# Patient Record
Sex: Male | Born: 1970 | Hispanic: Yes | Marital: Married | Smoking: Never smoker
Health system: Southern US, Community
[De-identification: ages and names within clinical notes are randomized; demographics above are authoritative.]

## PROBLEM LIST (undated history)

## (undated) DIAGNOSIS — I10 Essential (primary) hypertension: Secondary | ICD-10-CM

## (undated) HISTORY — DX: Essential (primary) hypertension: I10

---

## 2012-08-14 ENCOUNTER — Ambulatory Visit: Payer: Self-pay | Admitting: Physician Assistant

## 2015-08-02 ENCOUNTER — Emergency Department: Payer: Self-pay

## 2015-08-02 ENCOUNTER — Encounter: Payer: Self-pay | Admitting: Emergency Medicine

## 2015-08-02 ENCOUNTER — Emergency Department
Admission: EM | Admit: 2015-08-02 | Discharge: 2015-08-03 | Disposition: A | Discharge status: Home / Self Care | Payer: Self-pay | Attending: Emergency Medicine | Admitting: Emergency Medicine

## 2015-08-02 DIAGNOSIS — N200 Calculus of kidney: Secondary | ICD-10-CM | POA: Insufficient documentation

## 2015-08-02 LAB — COMPREHENSIVE METABOLIC PANEL
ALBUMIN: 4.5 g/dL (ref 3.5–5.0)
ALK PHOS: 72 U/L (ref 38–126)
ALT: 27 U/L (ref 17–63)
ANION GAP: 7 (ref 5–15)
AST: 25 U/L (ref 15–41)
BILIRUBIN TOTAL: 0.5 mg/dL (ref 0.3–1.2)
BUN: 16 mg/dL (ref 6–20)
CALCIUM: 9.4 mg/dL (ref 8.9–10.3)
CO2: 26 mmol/L (ref 22–32)
Chloride: 106 mmol/L (ref 101–111)
Creatinine, Ser: 0.98 mg/dL (ref 0.61–1.24)
GFR calc Af Amer: >60 mL/min (ref >60)
GLUCOSE: 101 mg/dL — AB (ref 65–99)
POTASSIUM: 3.4 mmol/L — AB (ref 3.5–5.1)
Sodium: 139 mmol/L (ref 135–145)
TOTAL PROTEIN: 8 g/dL (ref 6.5–8.1)

## 2015-08-02 LAB — URINALYSIS COMPLETE WITH MICROSCOPIC (ARMC ONLY)
BACTERIA UA: NONE SEEN
Bilirubin Urine: NEGATIVE
Glucose, UA: NEGATIVE mg/dL
Ketones, ur: NEGATIVE mg/dL
LEUKOCYTES UA: NEGATIVE
NITRITE: NEGATIVE
PROTEIN: NEGATIVE mg/dL
SPECIFIC GRAVITY, URINE: 1.021 (ref 1.005–1.030)
pH: 5 (ref 5.0–8.0)

## 2015-08-02 LAB — CBC
HEMATOCRIT: 44 % (ref 40.0–52.0)
Hemoglobin: 14.7 g/dL (ref 13.0–18.0)
MCH: 29.5 pg (ref 26.0–34.0)
MCHC: 33.5 g/dL (ref 32.0–36.0)
MCV: 87.9 fL (ref 80.0–100.0)
PLATELETS: 295 10*3/uL (ref 150–440)
RBC: 5 MIL/uL (ref 4.40–5.90)
RDW: 13.4 % (ref 11.5–14.5)
WBC: 8.9 10*3/uL (ref 3.8–10.6)

## 2015-08-02 LAB — LIPASE, BLOOD: Lipase: 23 U/L (ref 11–51)

## 2015-08-02 MED ORDER — ONDANSETRON HCL 4 MG/2ML IJ SOLN
4.0000 mg | Freq: Once | INTRAMUSCULAR | Status: AC
  Administered 2015-08-03: 4 mg via INTRAVENOUS
  Filled 2015-08-02: qty 2

## 2015-08-02 MED ORDER — SODIUM CHLORIDE 0.9 % IV BOLUS (SEPSIS)
1000.0000 mL | Freq: Once | INTRAVENOUS | Status: AC
  Administered 2015-08-03: 1000 mL via INTRAVENOUS

## 2015-08-02 MED ORDER — ONDANSETRON 4 MG PO TBDP: 4.0000 mg | ORAL_TABLET | Freq: Three times a day (TID) | ORAL | Status: DC | PRN

## 2015-08-02 MED ORDER — MORPHINE SULFATE (PF) 2 MG/ML IV SOLN
2.0000 mg | Freq: Once | INTRAVENOUS | Status: AC
  Administered 2015-08-03: 2 mg via INTRAVENOUS
  Filled 2015-08-02: qty 1

## 2015-08-02 MED ORDER — OXYCODONE HCL 5 MG PO TABS: 5.0000 mg | ORAL_TABLET | ORAL | Status: DC | PRN

## 2015-08-02 NOTE — ED Notes (Signed)
Pt states bilateral lower quadrant pain that began today after eating. Pt states 'i can feel it when i walk" pt states has had vomiting. Pt appears uncomfortable with ambulation.

## 2015-08-02 NOTE — ED Provider Notes (Signed)
[facility] Emergency Department Provider Note   ____________________________________________  Time seen: Approximately 11:19 PM  I have reviewed the triage vital signs and the nursing notes.   HISTORY  Chief Complaint Abdominal Pain    HPI Victor GrimesJoel Arabie is a 45 y.o. male who presents to the ED from home with a chief complaint of abdominal and flank pain. Patient states he was eating approximately 3 hours ago when he developed upper abdominal cramping which radiated into his back. Now complains of left-sided flank pain radiating to his left groin. Symptoms associated with nausea and vomiting 1. Denies associated fever, chills, chest pain, shortness of breath, dysuria, hematuria, diarrhea. Denies recent travel or trauma. Nothing makes his symptoms better or worse.   Past medical history None  There are no active problems to display for this patient.   History reviewed. No pertinent past surgical history.  Current Outpatient Rx  Name  Route  Sig  Dispense  Refill  . ondansetron (ZOFRAN ODT) 4 MG disintegrating tablet   Oral   Take 1 tablet (4 mg total) by mouth every 8 (eight) hours as needed for nausea or vomiting.   20 tablet   0   . oxyCODONE (ROXICODONE) 5 MG immediate release tablet   Oral   Take 1 tablet (5 mg total) by mouth every 4 (four) hours as needed for severe pain.   20 tablet   0     Allergies Ibuprofen and Tylenol  History reviewed. No pertinent family history.  Social History Social History  Substance Use Topics  . Smoking status: Never Smoker   . Smokeless tobacco: Never Used  . Alcohol Use: No    Review of Systems  Constitutional: No fever/chills. Eyes: No visual changes. ENT: No sore throat. Cardiovascular: Denies chest pain. Respiratory: Denies shortness of breath. Gastrointestinal: Positive for abdominal pain.  As a for nausea and vomiting.  No diarrhea.  No constipation. Genitourinary: Negative for  dysuria. Musculoskeletal: Positive for back pain. Skin: Negative for rash. Neurological: Negative for headaches, focal weakness or numbness.  10-point ROS otherwise negative.  ____________________________________________   PHYSICAL EXAM:  VITAL SIGNS: ED Triage Vitals  Enc Vitals Group     BP 08/02/15 2224 157/98 mmHg     Pulse Rate 08/02/15 2224 74     Resp 08/02/15 2224 18     Temp 08/02/15 2224 97.8 F (36.6 C)     Temp Source 08/02/15 2224 Oral     SpO2 08/02/15 2224 97 %     Weight 08/02/15 2224 165 lb (74.844 kg)     Height 08/02/15 2224 5\' 5"  (1.651 m)     Head Cir --      Peak Flow --      Pain Score 08/02/15 2229 8     Pain Loc --      Pain Edu? --      Excl. in GC? --     Constitutional: Alert and oriented. Well appearing and in no acute distress. Eyes: Conjunctivae are normal. PERRL. EOMI. Head: Atraumatic. Nose: No congestion/rhinnorhea. Mouth/Throat: Mucous membranes are moist.  Oropharynx non-erythematous. Neck: No stridor.   Cardiovascular: Normal rate, regular rhythm. Grossly normal heart sounds.  Good peripheral circulation. Respiratory: Normal respiratory effort.  No retractions. Lungs CTAB. Gastrointestinal: Soft and nontender. No distention. No abdominal bruits. No CVA tenderness. Musculoskeletal: No lower extremity tenderness nor edema.  No joint effusions. Neurologic:  Normal speech and language. No gross focal neurologic deficits are appreciated. No gait  instability. Skin:  Skin is warm, dry and intact. No rash noted. Psychiatric: Mood and affect are normal. Speech and behavior are normal.  ____________________________________________   LABS (all labs ordered are listed, but only abnormal results are displayed)  Labs Reviewed  COMPREHENSIVE METABOLIC PANEL - Abnormal; Notable for the following:    Potassium 3.4 (*)    Glucose, Bld 101 (*)    All other components within normal limits  URINALYSIS COMPLETEWITH MICROSCOPIC ([facility] ONLY) -  Abnormal; Notable for the following:    Color, Urine YELLOW (*)    APPearance CLEAR (*)    Hgb urine dipstick 2+ (*)    Squamous Epithelial / LPF 0-5 (*)    All other components within normal limits  CBC  LIPASE, BLOOD   ____________________________________________  EKG  None ____________________________________________  RADIOLOGY  CT renal stone study interpreted per Dr. Cherly Hensen: 1. No acute abnormality seen within the abdomen or pelvis. 2. Small stone seen dependently within the bladder, likely reflecting a recently passed stone. 3. Tiny nonobstructing 3 mm stone at the interpole region of the right kidney. ____________________________________________   PROCEDURES  Procedure(s) performed: None  Critical Care performed: No  ____________________________________________   INITIAL IMPRESSION / ASSESSMENT AND PLAN / ED COURSE  Pertinent labs & imaging results that were available during my care of the patient were reviewed by me and considered in my medical decision making (see chart for details).  45-year-old male who presents with left flank to groin pain. Pain is significantly improved currently without intervention. Laboratory urinalysis results notable for microscopic hematuria; suspect nephrolithiasis. Will obtain CT renal stone study, initiate IV fluid resuscitation, IV analgesia and reassess.  ----------------------------------------- 12:43 AM on 08/03/2015 -----------------------------------------  Patient improved. Updated patient and family members of CT results. Plan for analgesia and follow-up with PCP. Strict return precautions given. Patient verbalizes understanding and agrees with plan of care. ____________________________________________   FINAL CLINICAL IMPRESSION(S) / ED DIAGNOSES  Final diagnoses:  Kidney stone      NEW MEDICATIONS STARTED DURING THIS VISIT:  New Prescriptions   ONDANSETRON (ZOFRAN ODT) 4 MG DISINTEGRATING TABLET    Take 1  tablet (4 mg total) by mouth every 8 (eight) hours as needed for nausea or vomiting.   OXYCODONE (ROXICODONE) 5 MG IMMEDIATE RELEASE TABLET    Take 1 tablet (5 mg total) by mouth every 4 (four) hours as needed for severe pain.     Note:  This document was prepared using Dragon voice recognition software and may include unintentional dictation errors.    Irean Hong, MD 08/03/15 [phone]

## 2015-08-02 NOTE — ED Notes (Signed)
Pt states pain is more on left side and radiates to left groin.

## 2015-08-02 NOTE — ED Notes (Signed)
Patient transported to CT 

## 2015-08-02 NOTE — Discharge Instructions (Signed)
1. You may take pain and nausea medicines as needed (oxycodone/Zofran #20). 2. Drink plenty of bottled or filtered water daily. 3. Return to the ER for worsening symptoms, persistent vomiting, fever or other concerns.  Clculos renais (Kidney Stones) Os clculos renais (urolitase) so depsitos que se formam nos rins. A dor intensa  causada pelo deslocamento do clculo pelo trato urinrio. Quando o clculo se move, a uretra sofre um espasmo ao redor dele. O clculo  em geral eliminado pela urina.  CAUSAS   Um distrbio que faz com que certas glndulas do pescoo produzam hormnio paratireideo demais (hipertireoidismo primrio).  Um acmulo de cristais de cido rico, similar  gota, nas articulaes.  Estreitamento  Furniture conservator/restorer.  Uma obstruo do rim presente no nascimento (obstruo congnita).  Cirurgia anterior no rim ou ureteres.  Numerosas infeces renais. SINTOMAS   Enjoo estomacal (nusea).  Vmitos.  Sangue na urina (hematria).  Dor que em geral se espalha (irradia) para a virilha.  Necessidade de [place] com frequncia ou [place]. DIAGNSTICO   Histrico mdico e exame fsico.  Exames de sangue e de urina.  Tomografia computadorizada.  Ocasionalmente, um exame da parte interna da bexiga (cistoscopia  realizado. TRATAMENTO   Observao.  Aumento da ingesto de lquidos.  Litotripsia extracorprea por ondas de choque - este  um procedimento no invasivo que [place] ondas de choque para Research scientist (physical sciences) clculos renais.  Cirurgia pode ser [place] voc experimente dores intensas ou obstruo persistente. H diversos procedimentos cirrgicos. A maioria dos procedimentos  realizada com o uso de pequenos instrumentos. Somente pequenas incises so necessrias para acomodar esses instrumentos. Assim, o tempo de recuperao  minimizado. O tamanho, a localizao e a composio qumica so todas variveis importantes que determinaro a escolha do mtodo de ao  apropriado para voc. [place] com o seu profissional de sade para entender melhor sua situao de maneira a minimizar o risco de leses para voc e para o seu rim.  INSTRUES PARA O TRATAMENTO DOMICILIAR   Beba gua e lquidos em quantidade suficiente para manter a urina clara ou amarelo-plida. Isso ajudar voc a eliminar o clculo ou os fragmentos de clculo.  Passe toda a urina pelo filtro fornecido. Guarde todo o material particulado e todos os clculos para o seu profissional de Museum/gallery exhibitions officer. O clculo causador da dor pode ser to pequeno quanto um gro de sal.  muito importante usar o filtro todas as vezes que Holiday representative. Guardar o clculo permitir que o seu profissional de sade o analise e confirme que o clculo foi de fato eliminado. A anlise do clculo com frequncia indica o que voc pode fazer para reduzir a incidncia de Journalist, newspaper.  Tome somente medicamentos vendidos sem receita mdica em caso de dor, desconforto ou febre, conforme indicado pelo seu profissional de sade.  Comparea a todas as consultas de acompanhamento conforme as orientaes do seu mdico. Isso  importante.  Faa exames de raios X conforme solicitado. A ausncia de dor nem sempre significa que o clculo foi eliminado. Ele pode ter apenas parado de se deslocar. Caso a urina permanea completamente obstruda, isso pode causar perda da funo renal e at [employer] do rim.  sua responsabilidade certificar-se de que as radiografias e acompanhamentos sejam realizados. Ultrassom do rim pode mostrar bloqueios e a situao do rim. O ultrassom no envolve radiao e pode ser realizado facilmente em questo de minutos.  Faa alteraes na sua dieta diria de acordo com as orientaes do seu mdico. Ele pode dizer para  voc:  Limitar a quantidade de sal que ingere.  Comer 5 ou mais pores de frutas e verduras todos os dias.  Limitar a quantidade de carne, aves, peixe e ovos que voc  ingere.  Coletar uma amostra de urina de 24 horas, de acordo com as instrues do seu mdico. Voc poder precisar coletar uma Annice Needynova amostra de urina a cada 6-12 meses. PROCURE UM MDICO SE:  Sentir dores progressivas que no respondem ao analgsico prescrito. PROCURE UM MDICO IMEDIATAMENTE SE:   A dor no puder ser controlada com o medicamento prescrito.  Voc apresentar febre ou calafrios com tremores.  A gravidade ou a intensidade da dor aumentar por mais de 18 horas e no for WellPointaliviada com analgsicos.  Voc desenvolver novas dores abdominais.  Sentir-se fraco ou prestes a desmaiar.  No conseguir urinar.   Estas informaes no se destinam a substituir as recomendaes de seu mdico. No deixe de discutir quaisquer dvidas com seu mdico.   Document Released: 02/21/2005 Document Revised: 11/12/2014 Elsevier Interactive Patient Education [employer]2016 Elsevier Inc.

## 2017-02-17 ENCOUNTER — Emergency Department: Payer: Self-pay

## 2017-02-17 ENCOUNTER — Emergency Department
Admission: EM | Admit: 2017-02-17 | Discharge: 2017-02-17 | Disposition: A | Discharge status: Home / Self Care | Payer: Self-pay | Attending: Emergency Medicine | Admitting: Emergency Medicine

## 2017-02-17 ENCOUNTER — Other Ambulatory Visit: Payer: Self-pay

## 2017-02-17 DIAGNOSIS — Z79899 Other long term (current) drug therapy: Secondary | ICD-10-CM | POA: Insufficient documentation

## 2017-02-17 DIAGNOSIS — R509 Fever, unspecified: Secondary | ICD-10-CM | POA: Insufficient documentation

## 2017-02-17 DIAGNOSIS — R002 Palpitations: Secondary | ICD-10-CM | POA: Insufficient documentation

## 2017-02-17 LAB — BASIC METABOLIC PANEL
Anion gap: 8 (ref 5–15)
BUN: 12 mg/dL (ref 6–20)
CALCIUM: 9.1 mg/dL (ref 8.9–10.3)
CO2: 27 mmol/L (ref 22–32)
CREATININE: 1.02 mg/dL (ref 0.61–1.24)
Chloride: 102 mmol/L (ref 101–111)
GFR calc Af Amer: >60 mL/min (ref >60)
GFR calc non Af Amer: >60 mL/min (ref >60)
GLUCOSE: 105 mg/dL — AB (ref 65–99)
Potassium: 3.8 mmol/L (ref 3.5–5.1)
Sodium: 137 mmol/L (ref 135–145)

## 2017-02-17 LAB — TROPONIN I

## 2017-02-17 LAB — CBC
HCT: 43.3 % (ref 40.0–52.0)
HEMOGLOBIN: 14.8 g/dL (ref 13.0–18.0)
MCH: 29.7 pg (ref 26.0–34.0)
MCHC: 34.2 g/dL (ref 32.0–36.0)
MCV: 86.7 fL (ref 80.0–100.0)
Platelets: 297 10*3/uL (ref 150–440)
RBC: 5 MIL/uL (ref 4.40–5.90)
RDW: 13 % (ref 11.5–14.5)
WBC: 11.9 10*3/uL — ABNORMAL HIGH (ref 3.8–10.6)

## 2017-02-17 LAB — FIBRIN DERIVATIVES D-DIMER (ARMC ONLY): Fibrin derivatives D-dimer (ARMC): 116.13 ng/mL (FEU) (ref 0.00–499.00)

## 2017-02-17 NOTE — ED Triage Notes (Signed)
Pt presents to ED with c/o L sided CP and felt like heart was beating faster while at work. Denies any other symptoms. Alert, oriented, ambulatory. States symptoms began while sitting, denies over exertion.

## 2017-02-17 NOTE — ED Provider Notes (Signed)
[facility] Emergency Department Provider Note  Time seen: 3:07 PM  I have reviewed the triage vital signs and the nursing notes.   HISTORY  Chief Complaint Chest Pain    HPI Victor Russo is a 46 y.o. male with no past medical history who presents to the emergency department for chest palpitations and chills/fever.  According to the patient since this morning he has been experiencing some mild left-sided chest discomfort with a feeling of palpitations.  He also states he feels a sensation of fever all over his body.  States mild nasal congestion.  Denies cough.  Denies abdominal pain.  States he feels like he is hungry and wants to eat.   History reviewed. No pertinent past medical history.  There are no active problems to display for this patient.   History reviewed. No pertinent surgical history.  Prior to Admission medications   Medication Sig Start Date End Date Taking? Authorizing Provider  ondansetron (ZOFRAN ODT) 4 MG disintegrating tablet Take 1 tablet (4 mg total) by mouth every 8 (eight) hours as needed for nausea or vomiting. 08/02/15   Irean HongSung, Jade J, MD  oxyCODONE (ROXICODONE) 5 MG immediate release tablet Take 1 tablet (5 mg total) by mouth every 4 (four) hours as needed for severe pain. 08/02/15   Irean HongSung, Jade J, MD    Allergies  Allergen Reactions  . Ibuprofen Hives  . Tylenol [Acetaminophen] Hives    History reviewed. No pertinent family history.  Social History Social History   Tobacco Use  . Smoking status: Never Smoker  . Smokeless tobacco: Never Used  Substance Use Topics  . Alcohol use: No  . Drug use: No    Review of Systems Constitutional: Subjective fever. ENT: Mild congestion Cardiovascular: Mild left-sided chest discomfort/palpitations since this morning Respiratory: Negative for shortness of breath.  Negative for cough Gastrointestinal: Negative for abdominal pain, vomiting  Musculoskeletal: Negative for leg pain or  swelling All other ROS negative  ____________________________________________   PHYSICAL EXAM:  VITAL SIGNS: ED Triage Vitals  Enc Vitals Group     BP 02/17/17 1310 (!) 155/95     Pulse Rate 02/17/17 1310 (!) 108     Resp 02/17/17 1310 18     Temp 02/17/17 1310 99.4 F (37.4 C)     Temp Source 02/17/17 1310 Oral     SpO2 02/17/17 1310 100 %     Weight 02/17/17 1312 184 lb (83.5 kg)     Height 02/17/17 1312 5\' 5"  (1.651 m)     Head Circumference --      Peak Flow --      Pain Score 02/17/17 1310 5     Pain Loc --      Pain Edu? --      Excl. in GC? --    Constitutional: Alert and oriented. Well appearing and in no distress. Eyes: Normal exam ENT   Head: Normocephalic and atraumatic.   Mouth/Throat: Mucous membranes are moist. Cardiovascular: Normal rate, regular rhythm. No murmur Respiratory: Normal respiratory effort without tachypnea nor retractions. Breath sounds are clear Gastrointestinal: Soft and nontender. No distention.   Musculoskeletal: Nontender with normal range of motion in all extremities. No lower extremity tenderness or edema. Neurologic:  Normal speech and language. No gross focal neurologic deficits Skin:  Skin is warm, dry and intact.  Psychiatric: Mood and affect are normal.   ____________________________________________    EKG  EKG reviewed and interpreted by myself shows sinus tachycardia 111 bpm with  a narrow QRS, normal axis, normal intervals, nonspecific ST changes but no ST elevation.  ____________________________________________    RADIOLOGY  Chest x-ray normal  ____________________________________________   INITIAL IMPRESSION / ASSESSMENT AND PLAN / ED COURSE  Pertinent labs & imaging results that were available during my care of the patient were reviewed by me and considered in my medical decision making (see chart for details).  Patient presents to the emergency department for palpitations and a sensation of fever.   Overall the patient appears extremely well.  Differential would include palpitations, viral illness, less likely ACS or PE.  Patient's labs have resulted largely within normal limits including a negative troponin, very slight leukocytosis.  Chest x-ray is normal.  Overall patient has a normal physical exam.  Given the patient's S1Q3T3 on EKG with mild tachycardia we will obtain a d-dimer as a precaution.  No pleuritic pain.  No leg pain or swelling.  No history of blood clot in the past.  The patient's tachycardia is more likely due to his low-grade fever.  D-dimer is negative.  Suspect likely early viral illness.  Discussed supportive care at home including Tylenol or ibuprofen plenty of fluids and follow-up with PCP if needed.  Patient agreeable to plan.  ____________________________________________   FINAL CLINICAL IMPRESSION(S) / ED DIAGNOSES  Palpitations Fever    Minna AntisPaduchowski, Sallyann Kinnaird, MD 02/17/17 [phone]

## 2017-07-01 ENCOUNTER — Emergency Department: Payer: Self-pay

## 2017-07-01 ENCOUNTER — Other Ambulatory Visit: Payer: Self-pay

## 2017-07-01 ENCOUNTER — Emergency Department
Admission: EM | Admit: 2017-07-01 | Discharge: 2017-07-01 | Disposition: A | Discharge status: Home / Self Care | Payer: Self-pay | Attending: Emergency Medicine | Admitting: Emergency Medicine

## 2017-07-01 DIAGNOSIS — N200 Calculus of kidney: Secondary | ICD-10-CM | POA: Insufficient documentation

## 2017-07-01 LAB — BASIC METABOLIC PANEL
Anion gap: 5 (ref 5–15)
BUN: 20 mg/dL (ref 6–20)
CALCIUM: 8.8 mg/dL — AB (ref 8.9–10.3)
CO2: 26 mmol/L (ref 22–32)
Chloride: 108 mmol/L (ref 101–111)
Creatinine, Ser: 1.04 mg/dL (ref 0.61–1.24)
GFR calc Af Amer: >60 mL/min (ref >60)
GLUCOSE: 119 mg/dL — AB (ref 65–99)
POTASSIUM: 3.9 mmol/L (ref 3.5–5.1)
Sodium: 139 mmol/L (ref 135–145)

## 2017-07-01 LAB — URINALYSIS, COMPLETE (UACMP) WITH MICROSCOPIC
Bacteria, UA: NONE SEEN
Bilirubin Urine: NEGATIVE
GLUCOSE, UA: NEGATIVE mg/dL
KETONES UR: NEGATIVE mg/dL
Leukocytes, UA: NEGATIVE
NITRITE: NEGATIVE
PROTEIN: NEGATIVE mg/dL
Specific Gravity, Urine: 1.021 (ref 1.005–1.030)
pH: 7 (ref 5.0–8.0)

## 2017-07-01 LAB — CBC
HEMATOCRIT: 43.4 % (ref 40.0–52.0)
Hemoglobin: 14.9 g/dL (ref 13.0–18.0)
MCH: 30.1 pg (ref 26.0–34.0)
MCHC: 34.4 g/dL (ref 32.0–36.0)
MCV: 87.5 fL (ref 80.0–100.0)
PLATELETS: 283 10*3/uL (ref 150–440)
RBC: 4.97 MIL/uL (ref 4.40–5.90)
RDW: 13 % (ref 11.5–14.5)
WBC: 8.3 10*3/uL (ref 3.8–10.6)

## 2017-07-01 MED ORDER — OXYCODONE HCL 5 MG PO TABS: 5.0000 mg | ORAL_TABLET | ORAL | 0 refills | Status: DC | PRN

## 2017-07-01 MED ORDER — ONDANSETRON 4 MG PO TBDP: 4.0000 mg | ORAL_TABLET | Freq: Three times a day (TID) | ORAL | 0 refills | Status: DC | PRN

## 2017-07-01 MED ORDER — HYDROMORPHONE HCL 1 MG/ML IJ SOLN
INTRAMUSCULAR | Status: AC
  Filled 2017-07-01: qty 1

## 2017-07-01 MED ORDER — SODIUM CHLORIDE 0.9 % IV SOLN
Freq: Once | INTRAVENOUS | Status: AC
  Administered 2017-07-01: 22:00:00 via INTRAVENOUS

## 2017-07-01 MED ORDER — MORPHINE SULFATE (PF) 2 MG/ML IV SOLN
2.0000 mg | Freq: Once | INTRAVENOUS | Status: AC
  Administered 2017-07-01: 2 mg via INTRAVENOUS
  Filled 2017-07-01: qty 1

## 2017-07-01 MED ORDER — HYDROMORPHONE HCL 1 MG/ML IJ SOLN
1.0000 mg | Freq: Once | INTRAMUSCULAR | Status: AC
  Administered 2017-07-01: 1 mg via INTRAMUSCULAR

## 2017-07-01 MED ORDER — MORPHINE SULFATE (PF) 4 MG/ML IV SOLN
4.0000 mg | Freq: Once | INTRAVENOUS | Status: AC
  Administered 2017-07-01: 4 mg via INTRAVENOUS
  Filled 2017-07-01: qty 1

## 2017-07-01 MED ORDER — ONDANSETRON HCL 4 MG/2ML IJ SOLN
4.0000 mg | Freq: Once | INTRAMUSCULAR | Status: AC
  Administered 2017-07-01: 4 mg via INTRAVENOUS
  Filled 2017-07-01: qty 2

## 2017-07-01 MED ORDER — HYDROMORPHONE HCL 1 MG/ML IJ SOLN: 0.5000 mg | Freq: Once | INTRAMUSCULAR | Status: DC

## 2017-07-01 MED ORDER — TAMSULOSIN HCL 0.4 MG PO CAPS: 0.4000 mg | ORAL_CAPSULE | Freq: Every day | ORAL | 0 refills | Status: DC

## 2017-07-01 MED ORDER — OXYCODONE HCL 5 MG PO TABS
5.0000 mg | ORAL_TABLET | Freq: Once | ORAL | Status: AC
Start: 2017-07-01 — End: 2017-07-01
  Administered 2017-07-01: 5 mg via ORAL
  Filled 2017-07-01: qty 1

## 2017-07-01 NOTE — ED Notes (Signed)
Patient given a warm blanket at this time.  

## 2017-07-01 NOTE — ED Triage Notes (Signed)
Pt states history of renal calculi. Pt complains of right sided flank pain radiating to right groin with nausea and vomiting for 2 hours. Pt appears in no acute distress, skin normal color warm and dry. No meds taken at home.

## 2017-07-01 NOTE — ED Notes (Signed)
Spoke with dr. Mayford Knife regarding pt's pain and allergy to ibuprofen and tylenol, no new pain medication orders received. kub ordered, explanation to pt given regarding dr. Jarrett Ables. Pt verbalizes understanding.

## 2017-07-01 NOTE — ED Provider Notes (Signed)
[facility] Emergency Department Provider Note       Time seen: ----------------------------------------- 10:52 PM on 07/01/2017 -----------------------------------------   I have reviewed the triage vital signs and the nursing notes.  HISTORY   Chief Complaint Flank Pain    HPI Victor Russo is a 47 y.o. male with a history of kidney stones who presents to the ED for acute onset right flank pain that radiates into his groin.  He is had the symptoms associated with nausea vomiting for the past 2 hours, nothing makes it better.  He denies fevers, chills or other complaints.  No past medical history on file.  There are no active problems to display for this patient.   No past surgical history on file.  Allergies Ibuprofen and Tylenol [acetaminophen]  Social History Social History   Tobacco Use  . Smoking status: Never Smoker  . Smokeless tobacco: Never Used  Substance Use Topics  . Alcohol use: No  . Drug use: No    Review of Systems Constitutional: Negative for fever. Cardiovascular: Negative for chest pain. Respiratory: Negative for shortness of breath. Gastrointestinal: Positive for flank pain, vomiting Genitourinary: Negative for dysuria. Musculoskeletal: Negative for back pain. Skin: Negative for rash. Neurological: Negative for headaches, focal weakness or numbness.  All systems negative/normal/unremarkable except as stated in the HPI  ____________________________________________   PHYSICAL EXAM:  VITAL SIGNS: ED Triage Vitals  Enc Vitals Group     BP 07/01/17 1901 138/82     Pulse Rate 07/01/17 1901 77     Resp 07/01/17 1901 16     Temp 07/01/17 1901 98 F (36.7 C)     Temp Source 07/01/17 1901 Oral     SpO2 07/01/17 1901 98 %     Weight 07/01/17 1902 180 lb (81.6 kg)     Height 07/01/17 1902  (1.651 m)     Head Circumference --      Peak Flow --      Pain Score 07/01/17 1901 8     Pain Loc --      Pain Edu? --       Excl. in GC? --    Constitutional: Alert and oriented.  Mild distress Eyes: Conjunctivae are normal. Normal extraocular movements. Cardiovascular: Normal rate, regular rhythm. No murmurs, rubs, or gallops. Respiratory: Normal respiratory effort without tachypnea nor retractions. Breath sounds are clear and equal bilaterally. No wheezes/rales/rhonchi. Gastrointestinal: Right flank tenderness, no rebound or guarding.  Normal bowel sounds. Musculoskeletal: Nontender with normal range of motion in extremities. No lower extremity tenderness nor edema. Neurologic:  Normal speech and language. No gross focal neurologic deficits are appreciated.  Skin:  Skin is warm, dry and intact. No rash noted. Psychiatric: Mood and affect are normal. Speech and behavior are normal.  ____________________________________________  ED COURSE:  As part of my medical decision making, I reviewed the following data within the electronic MEDICAL RECORD NUMBER History obtained from family if available, nursing notes, old chart and ekg, as well as notes from prior ED visits. Patient presented for flank pain, we will assess with labs and imaging as indicated at this time.   Procedures ____________________________________________   LABS (pertinent positives/negatives)  Labs Reviewed  URINALYSIS, COMPLETE (UACMP) WITH MICROSCOPIC - Abnormal; Notable for the following components:      Result Value   Color, Urine YELLOW (*)    APPearance CLEAR (*)    Hgb urine dipstick MODERATE (*)    RBC / HPF >50 (*)  All other components within normal limits  BASIC METABOLIC PANEL - Abnormal; Notable for the following components:   Glucose, Bld 119 (*)    Calcium 8.8 (*)    All other components within normal limits  CBC    RADIOLOGY  CT renal protocol IMPRESSION: 1. 3 mm right ureterovesicular junction stone causes mild right hydroureteronephrosis. This is the small stone noted on the current radiographs. 2. No other  acute abnormalities. 3. Two small right and 1 tiny left nonobstructing intrarenal stones. 4. Hepatic steatosis. ____________________________________________  DIFFERENTIAL DIAGNOSIS   Renal colic, UTI, pyelonephritis, musculoskeletal pain  FINAL ASSESSMENT AND PLAN  Renal colic   Plan: The patient had presented for right flank pain. Patient's labs were indicative of renal colic with hematuria. Patient's imaging did reveal a distal right ureteral stone.  His pain was controlled with morphine, Toradol and fluids with Zofran.  He will be discharged with similar, he is cleared for outpatient follow-up with urology.   Ulice Dash, MD   Note: This note was generated in part or whole with voice recognition software. Voice recognition is usually quite accurate but there are transcription errors that can and very often do occur. I apologize for any typographical errors that were not detected and corrected.     Emily Filbert, MD 07/01/17 2253

## 2017-07-01 NOTE — ED Notes (Signed)
Tech called to say pt had stopped her walking by sub wait to let her know his pain was not any better; back to speak with pt to let him know it's only been 20 minutes since his IM medication and it will take more time for medication to improve his pain; pt verbalized understanding

## 2017-07-01 NOTE — ED Notes (Signed)
Pt informed he cannot drive for 4 hours after pain medication and must remain seated in sub wait. Pt verbalizes understanding.

## 2017-12-31 ENCOUNTER — Emergency Department
Admission: EM | Admit: 2017-12-31 | Discharge: 2017-12-31 | Disposition: A | Discharge status: Home / Self Care | Payer: Medicaid Other | Attending: Emergency Medicine | Admitting: Emergency Medicine

## 2017-12-31 ENCOUNTER — Encounter: Payer: Self-pay | Admitting: Emergency Medicine

## 2017-12-31 ENCOUNTER — Other Ambulatory Visit: Payer: Self-pay

## 2017-12-31 DIAGNOSIS — S3992XA Unspecified injury of lower back, initial encounter: Secondary | ICD-10-CM | POA: Diagnosis present

## 2017-12-31 DIAGNOSIS — Y9289 Other specified places as the place of occurrence of the external cause: Secondary | ICD-10-CM | POA: Insufficient documentation

## 2017-12-31 DIAGNOSIS — Y33XXXA Other specified events, undetermined intent, initial encounter: Secondary | ICD-10-CM | POA: Diagnosis not present

## 2017-12-31 DIAGNOSIS — S39012A Strain of muscle, fascia and tendon of lower back, initial encounter: Secondary | ICD-10-CM | POA: Insufficient documentation

## 2017-12-31 DIAGNOSIS — Y9389 Activity, other specified: Secondary | ICD-10-CM | POA: Insufficient documentation

## 2017-12-31 DIAGNOSIS — Y990 Civilian activity done for income or pay: Secondary | ICD-10-CM | POA: Insufficient documentation

## 2017-12-31 MED ORDER — ORPHENADRINE CITRATE 30 MG/ML IJ SOLN
60.0000 mg | INTRAMUSCULAR | Status: AC
  Administered 2017-12-31: 60 mg via INTRAMUSCULAR
  Filled 2017-12-31: qty 2

## 2017-12-31 MED ORDER — CYCLOBENZAPRINE HCL 5 MG PO TABS: 5.0000 mg | ORAL_TABLET | Freq: Three times a day (TID) | ORAL | 0 refills | Status: DC | PRN

## 2017-12-31 MED ORDER — KETOROLAC TROMETHAMINE 30 MG/ML IJ SOLN
30.0000 mg | Freq: Once | INTRAMUSCULAR | Status: AC
  Administered 2017-12-31: 30 mg via INTRAMUSCULAR
  Filled 2017-12-31: qty 1

## 2017-12-31 MED ORDER — KETOROLAC TROMETHAMINE 10 MG PO TABS: 10.0000 mg | ORAL_TABLET | Freq: Three times a day (TID) | ORAL | 0 refills | Status: DC

## 2017-12-31 MED ORDER — DIPHENHYDRAMINE HCL 25 MG PO CAPS
25.0000 mg | ORAL_CAPSULE | Freq: Once | ORAL | Status: AC
  Administered 2017-12-31: 25 mg via ORAL
  Filled 2017-12-31: qty 1

## 2017-12-31 NOTE — ED Triage Notes (Signed)
Pt to ED via POV c/o back pain since Friday. Pt states that he was carrying some windows while working and hurt his back then. Pt states that the pain has continued to get worse. Pt is in NAD at this time.

## 2017-12-31 NOTE — Discharge Instructions (Addendum)
You will be treated for

## 2017-12-31 NOTE — ED Notes (Signed)
Back pain  abd pain   Pt  Vomited  3  Days  Ago    nausea   Last bm  Today  Pt awake  And  Alert and oriented

## 2017-12-31 NOTE — ED Provider Notes (Signed)
[facility] Emergency Department Provider Note ____________________________________________  Time seen: 1746  I have reviewed the triage vital signs and the nursing notes.  HISTORY  Chief Complaint  Back Pain  HPI Victor Russo is a 47 y.o. male presents to the ED for evaluation of lower back pain. He reports onset while at work on Thursday. 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  He reports that normally they should be moved by 2 people.  He describes sudden onset of low back pain at the time.  He was treated on Friday by a local chiropractor and reported 2 days of improvement until symptoms returned today.  He presents now with continued pain to the low back.  He denies any distal paresthesias, footdrop, bladder incontinence, saddle anesthesia, or weakness.  He has taken a single dose of Roxicet that he had from a recent kidney stone, today with some benefit to his symptoms.  He is also been using a TENS unit as well as a cool pad with some relief to his symptoms.  He denies any history of chronic ongoing low back pain.  Patient does report a sensitivity to ibuprofen and anti-inflammatories, but reports that he can tolerate these medications when he doses Benadryl along with them.  History reviewed. No pertinent past medical history.  There are no active problems to display for this patient.  History reviewed. No pertinent surgical history.  Prior to Admission medications   Medication Sig Start Date End Date Taking? Authorizing Provider  ondansetron (ZOFRAN ODT) 4 MG disintegrating tablet Take 1 tablet (4 mg total) by mouth every 8 (eight) hours as needed for nausea or vomiting. 08/02/15   Irean Hong, MD  ondansetron (ZOFRAN ODT) 4 MG disintegrating tablet Take 1 tablet (4 mg total) by mouth every 8 (eight) hours as needed for nausea or vomiting. 07/01/17    Emily Filbert, MD  oxyCODONE (ROXICODONE) 5 MG immediate release tablet Take 1 tablet (5 mg total) by mouth every 4 (four) hours as needed for severe pain. 08/02/15   Irean Hong, MD  oxyCODONE (ROXICODONE) 5 MG immediate release tablet Take 1 tablet (5 mg total) by mouth every 4 (four) hours as needed for severe pain. 07/01/17   Emily Filbert, MD  tamsulosin (FLOMAX) 0.4 MG CAPS capsule Take 1 capsule (0.4 mg total) by mouth daily after breakfast. 07/01/17   Emily Filbert, MD    Allergies Ibuprofen and Tylenol [acetaminophen]  No family history on file.  Social History Social History   Tobacco Use  . Smoking status: Never Smoker  . Smokeless tobacco: Never Used  Substance Use Topics  . Alcohol use: No  . Drug use: No    Review of Systems  Constitutional: Negative for fever. Cardiovascular: Negative for chest pain. Respiratory: Negative for shortness of breath. Gastrointestinal: Negative for abdominal pain, vomiting and diarrhea. Genitourinary: Negative for dysuria. Musculoskeletal: Positive for back pain. Skin: Negative for rash. Neurological: Negative for headaches, focal weakness or numbness. ____________________________________________  PHYSICAL EXAM:  VITAL SIGNS: ED Triage Vitals  Enc Vitals Group     BP 12/31/17 1645 (!) 152/94     Pulse Rate 12/31/17 1645 85     Resp 12/31/17 1645 20     Temp 12/31/17 1645 98.3 F (36.8 C)     Temp Source 12/31/17 1645 Oral     SpO2 12/31/17 1645 98 %  Weight --      Height --      Head Circumference --      Peak Flow --      Pain Score 12/31/17 1649 8     Pain Loc --      Pain Edu? --      Excl. in GC? --     Constitutional: Alert and oriented. Well appearing and in no distress. Head: Normocephalic and atraumatic. Eyes: Conjunctivae are normal. Normal extraocular movements Cardiovascular: Normal rate, regular rhythm. Normal distal pulses. Respiratory: Normal respiratory effort. No  wheezes/rales/rhonchi. Gastrointestinal: Soft and nontender. No distention, bowel, guarding, or rigidity.  Normal bowel sounds noted. Musculoskeletal: Normal spinal alignment without midline tenderness, spasm, deformity, or step-off.  Patient transitions from supine to sit without assistance. Nontender with normal range of motion in all extremities.  Neurologic: Cranial nerves II through XII grossly intact.  Normal LE DTRs bilaterally.  Normal toe dorsiflexion foot eversion on exam.  Negative seated straight leg raise bilaterally.  Normal gait without ataxia. Normal speech and language. No gross focal neurologic deficits are appreciated. Skin:  Skin is warm, dry and intact. No rash noted. ____________________________________________  PROCEDURES  Procedures Toradol 30 mg IM Norflex 60 mg IM Benadryl 25 mg PO ____________________________________________  INITIAL IMPRESSION / ASSESSMENT AND PLAN / ED COURSE  Patient with ED evaluation of acute lumbar strain.  Patient reports improvement of his symptoms after I M administration of ketorolac and Norflex.  He is given Benadryl to help counter any potential urticaria or hives.  Patient responded well and is without any adverse reaction.  Will be discharged with a prescription for ketorolac to dose as directed.  He is advised he probably should continue dosing Benadryl as needed with the medication to prevent any allergic reaction.  He will follow-up with his companies provider or [place] urgent care for ongoing symptom management.  Work is provided for 3 days as requested. ____________________________________________  FINAL CLINICAL IMPRESSION(S) / ED DIAGNOSES  Final diagnoses:  Strain of lumbar region, initial encounter      Lissa Hoard, PA-C 12/31/17 2004    Jeanmarie Plant, MD 01/11/18 [phone]

## 2019-05-02 IMAGING — CR DG ABDOMEN 1V
1 series · 2 of 2 positions shown · non-contrast
Comparison: CT from 08/02/2015

CLINICAL DATA: History of right renal calculi. Patient presents
with right-sided flank and groin pain with nausea and vomiting x2
hours.

EXAM:
ABDOMEN - 1 VIEW

[Series 1: dg abd 1 view · 0.14mm/px · 2 of 2 slices shown]
[im 1/2]
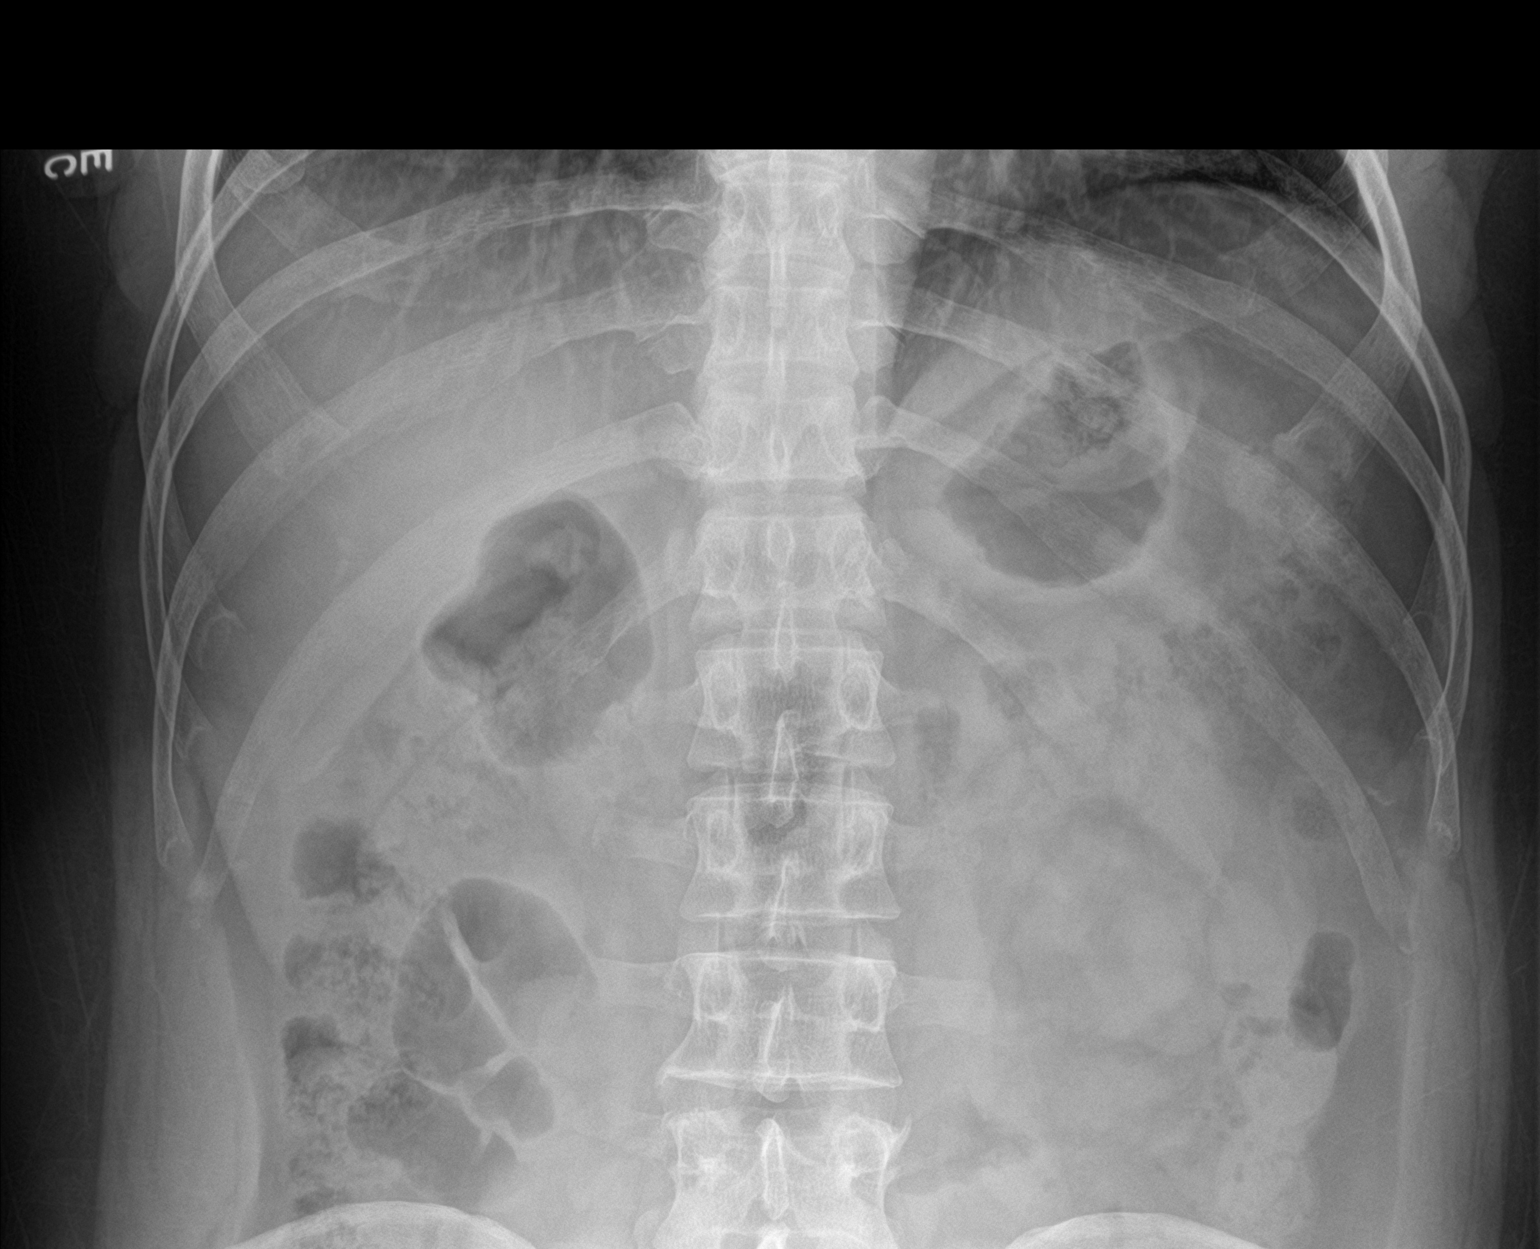
[im 2/2]
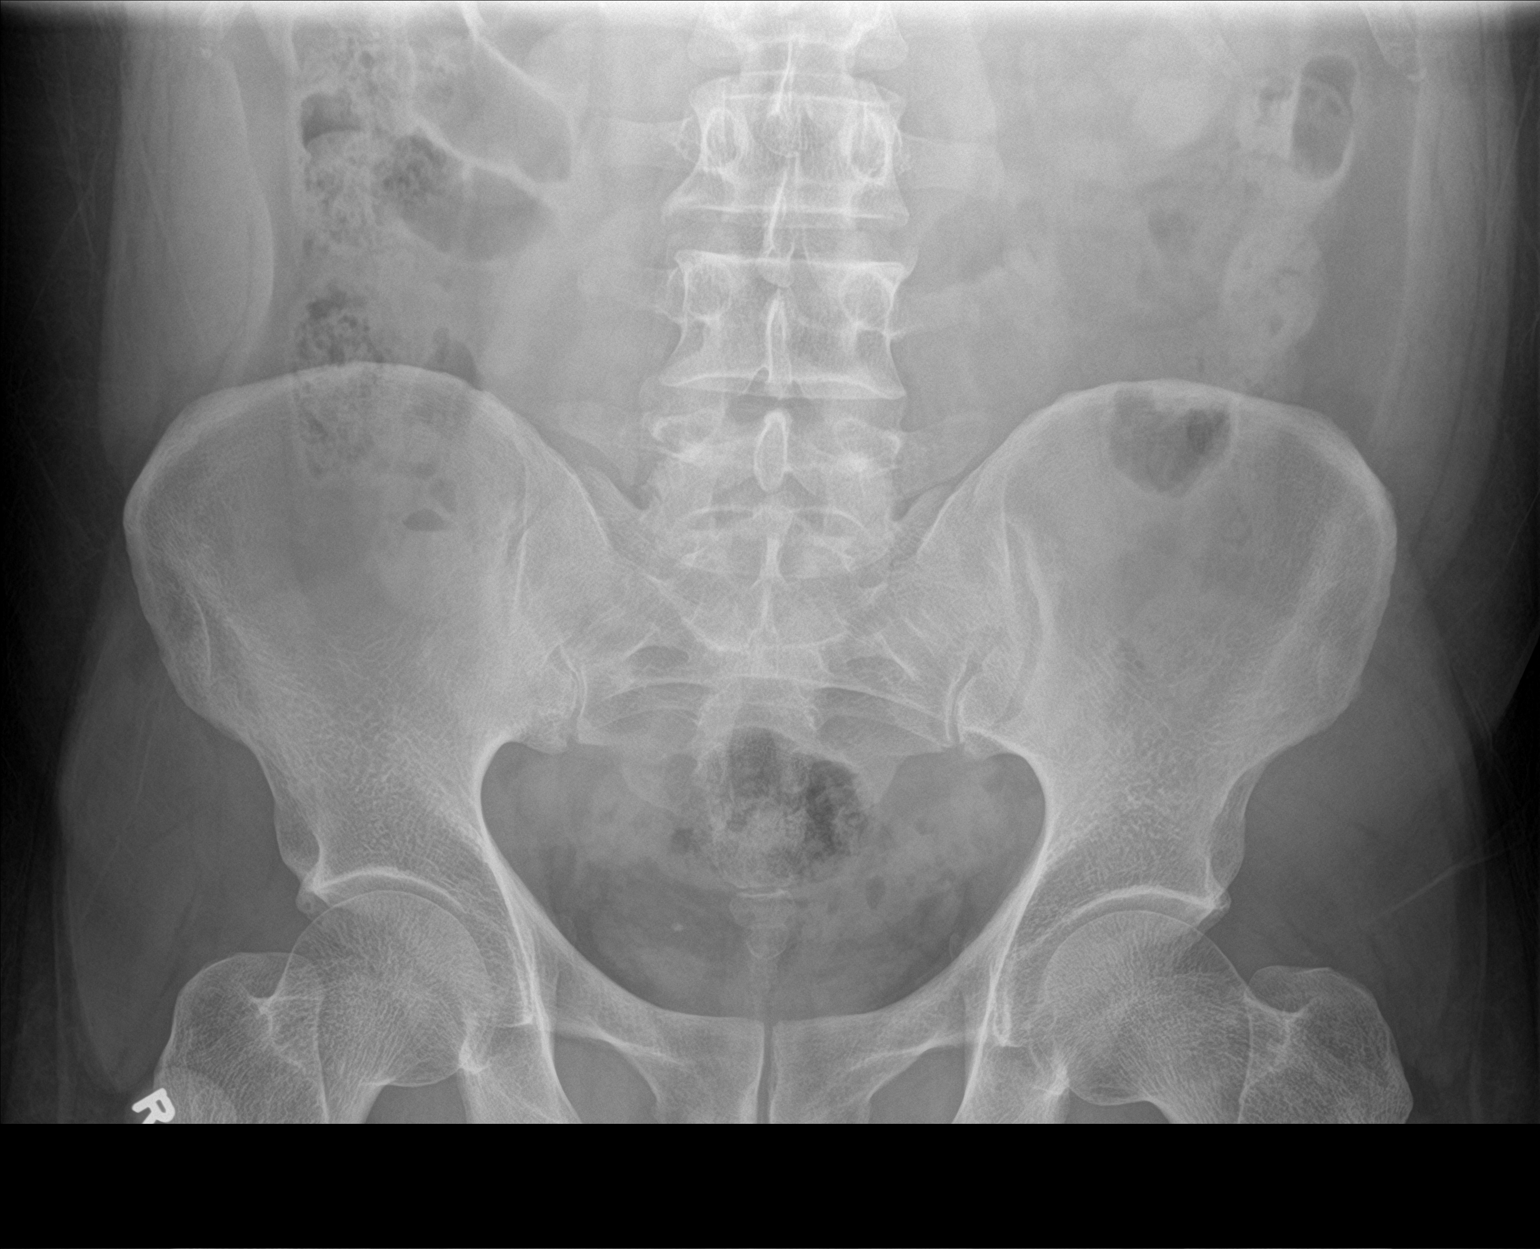

[2 of 2 positions shown; findings below may reference images not displayed]

FINDINGS: A punctate 2 mm calculus projects within the right hemipelvis which
may correspond with the patient's known bladder calculus as seen on
prior CT. A new distal ureteral calculus is not entirely excluded.
Both renal shadows are obscured by overlying bowel. No definite
uropathy identified along the course of the expected ureters.
Unremarkable bowel gas pattern. No acute osseous abnormality.
IMPRESSION: 2 mm calcification in the right hemipelvis may reflect the patient's
known bladder calculus. New distal right ureteral calculus however
is not entirely excluded given patient's right-sided flank and groin
pain. CT may help for further correlation.

## 2019-05-02 IMAGING — CT CT RENAL STONE PROTOCOL
2 of 4 series · 16 of 46 positions shown, 18 images · non-contrast
Comparison: Current abdominal radiographs noting a small
calcification in the right pelvis. Prior CT, 08/02/2015.

CLINICAL DATA: Pt states history of renal calculi. Pt complains of
right sided flank pain radiating to right groin with nausea and
vomiting for 2 hours

EXAM:
CT ABDOMEN AND PELVIS WITHOUT CONTRAST
TECHNIQUE: Multidetector CT imaging of the abdomen and pelvis was performed
following the standard protocol without IV contrast.

[Series 2: stone full standard · axial · 0.78mm/px · z∈[-455,-25]mm · 13 of 96 slices shown, 15 images]
[im 5/96  soft-tissue]
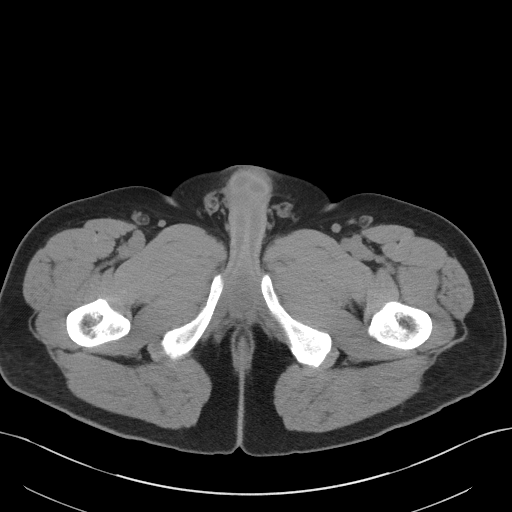
[im 5/96  bone]
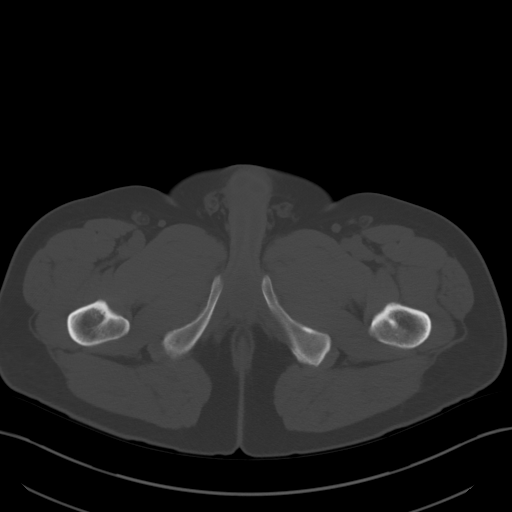
[im 13/96  soft-tissue]
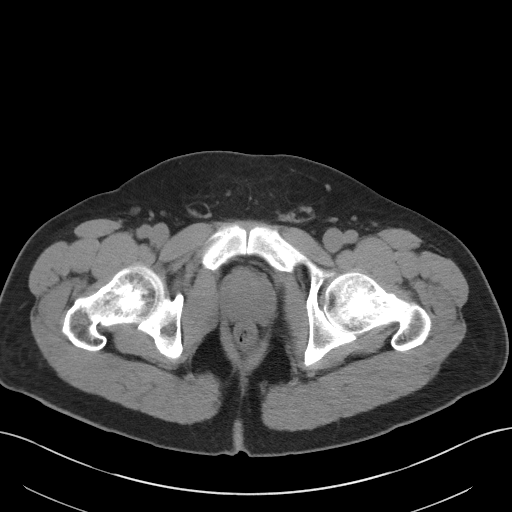
[im 21/96  soft-tissue]
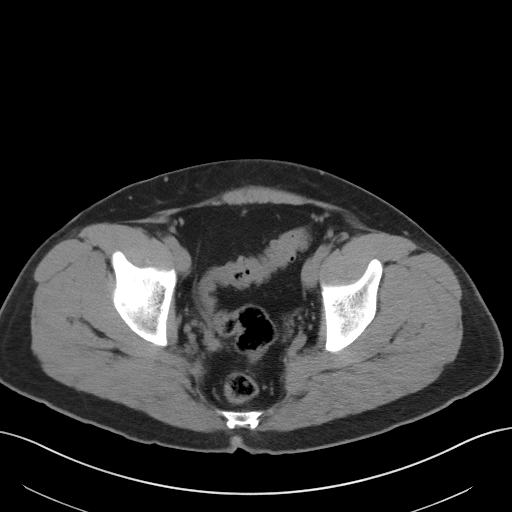
[im 25/96  soft-tissue]
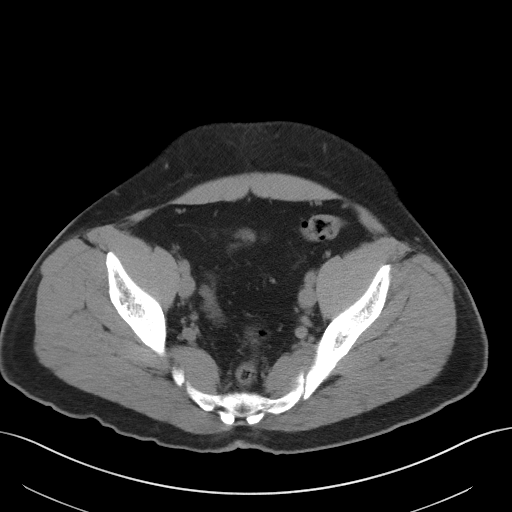
[im 34/96  soft-tissue]
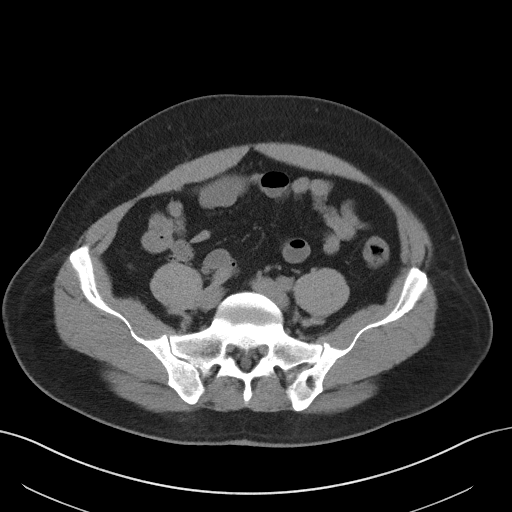
[im 42/96  soft-tissue]
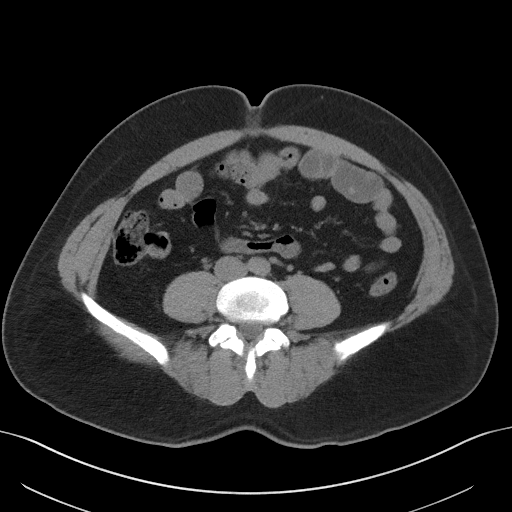
[im 50/96  soft-tissue]
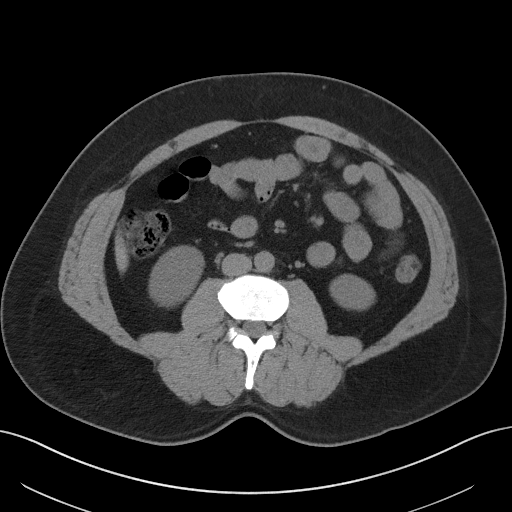
[im 54/96  soft-tissue]
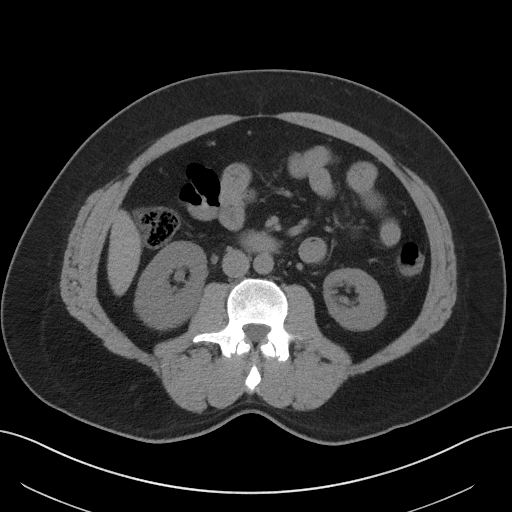
[im 62/96  soft-tissue]
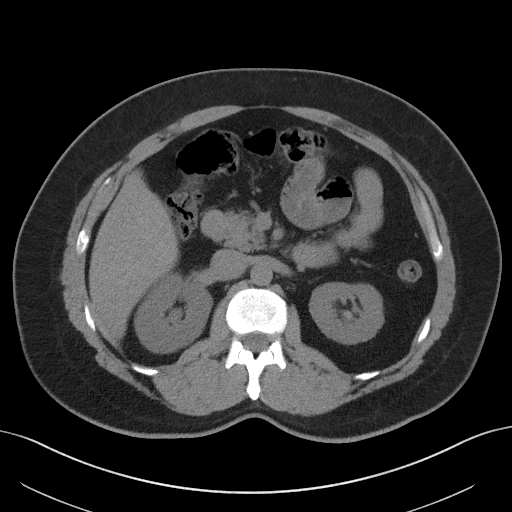
[im 62/96  bone]
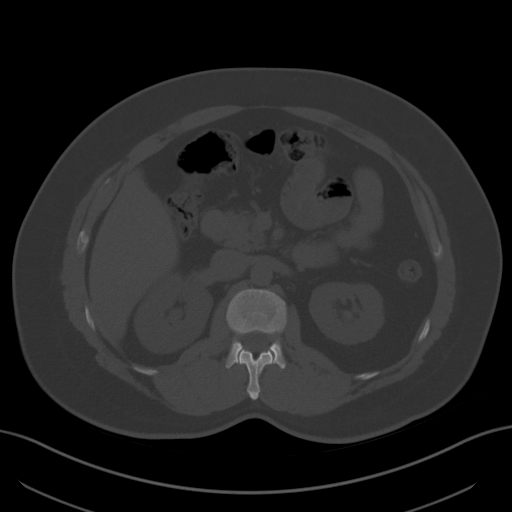
[im 71/96  soft-tissue]
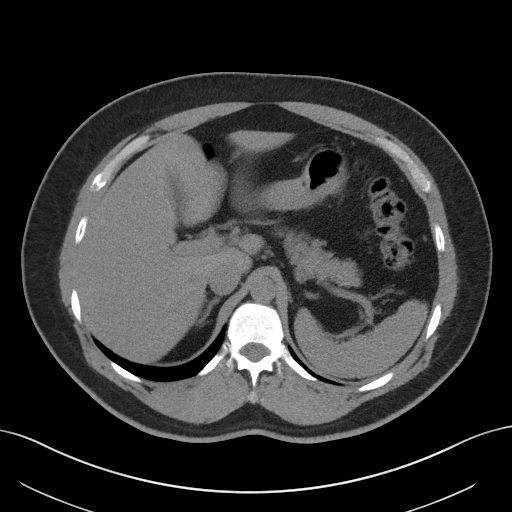
[im 75/96  soft-tissue]
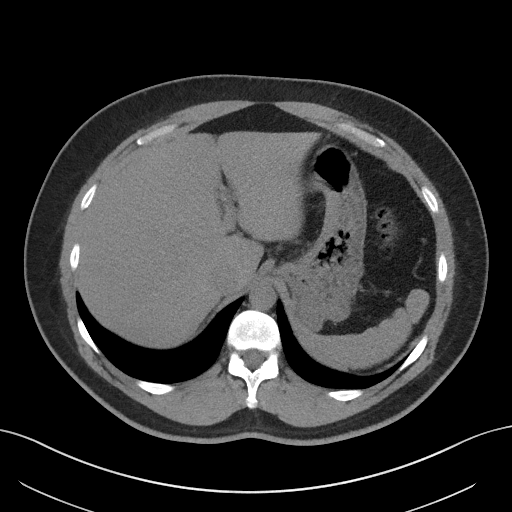
[im 83/96  soft-tissue]
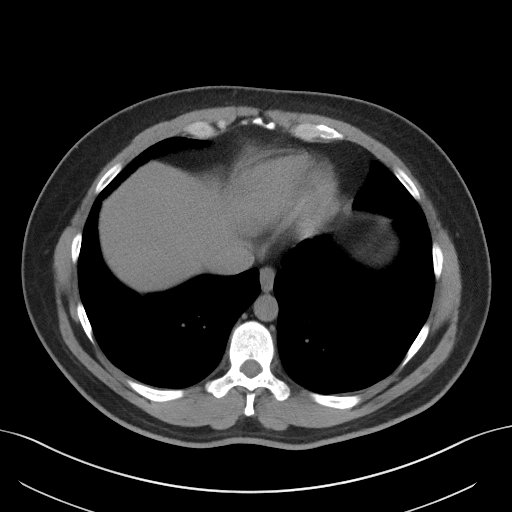
[im 91/96  soft-tissue]
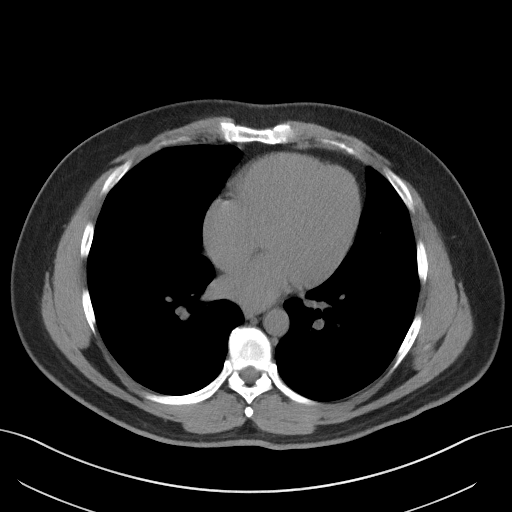

[Series 5: coronal · coronal · 0.76mm/px · 3 of 153 slices shown]
[im 51/153  soft-tissue]
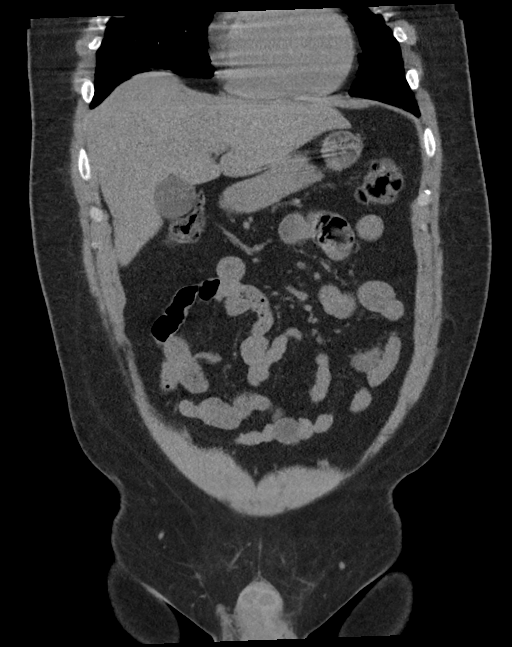
[im 68/153  soft-tissue]
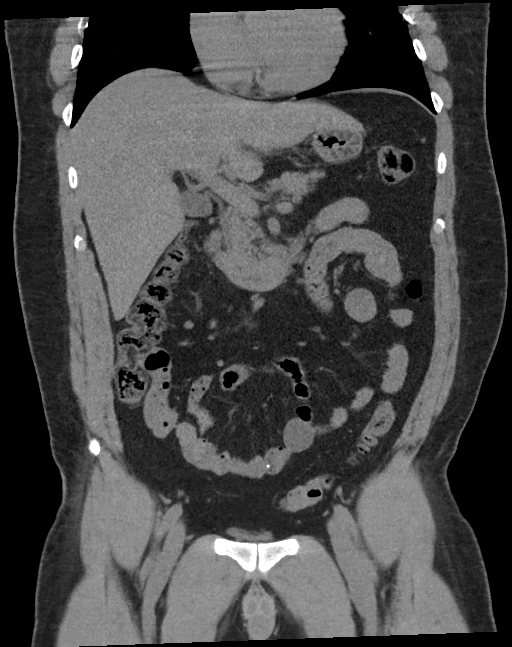
[im 85/153  soft-tissue]
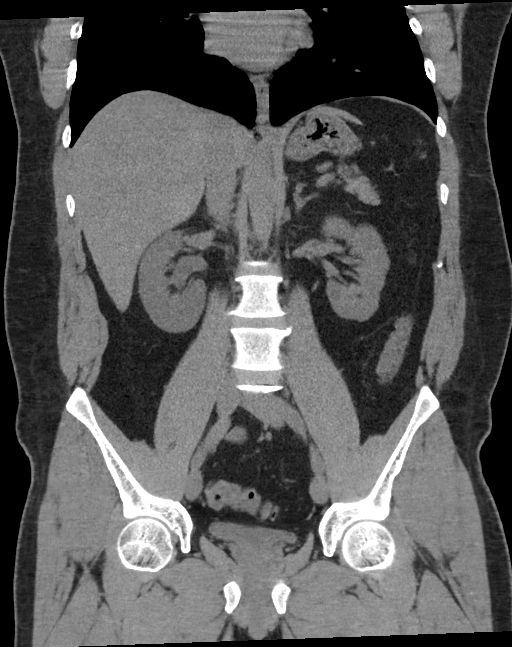

[16 of 46 positions shown; findings below may reference images not displayed]

FINDINGS: Lower chest: Clear lung bases.  Heart normal size.

Hepatobiliary: Decreased attenuation of the liver consistent with
fatty infiltration. No liver mass or focal lesion. Normal
gallbladder. No bile duct dilation.

Pancreas: Unremarkable. No pancreatic ductal dilatation or
surrounding inflammatory changes.

Spleen: Normal in size without focal abnormality.

Adrenals/Urinary Tract: No adrenal masses.

3 mm stone lies within the right ureterovesicular junction, leading
to mild right hydronephrosis and slight dilation of the right
ureter. There is mild right kidney edema.

Tiny upper pole nonobstructing stone in the left kidney. Two
nonobstructing stones in the right kidney. No renal masses. Left
intrarenal collecting system and ureter are unremarkable.

Normal bladder.

Stomach/Bowel: Stomach is within normal limits. Appendix appears
normal. No evidence of bowel wall thickening, distention, or
inflammatory changes.

Vascular/Lymphatic: No significant vascular findings are present. No
enlarged abdominal or pelvic lymph nodes.

Reproductive: Unremarkable.

Other: No abdominal wall hernia or abnormality. No abdominopelvic
ascites.

Musculoskeletal: No acute findings.  No suspicious bone lesions.
IMPRESSION: 1. 3 mm right ureterovesicular junction stone causes mild right
hydroureteronephrosis. This is the small stone noted on the current
radiographs.
2. No other acute abnormalities.
3. Two small right and 1 tiny left nonobstructing intrarenal stones.
4. Hepatic steatosis.

## 2019-06-01 ENCOUNTER — Ambulatory Visit: Payer: Medicaid Other | Attending: Internal Medicine

## 2019-06-01 DIAGNOSIS — Z23 Encounter for immunization: Secondary | ICD-10-CM

## 2019-06-01 NOTE — Progress Notes (Signed)
   Covid-19 Vaccination Clinic  Name:  Victor Russo    MRN: [id number] DOB: 1971-03-02  06/01/2019  Mr. Victor Russo was observed post Covid-19 immunization for 15 minutes without incident. He was provided with Vaccine Information Sheet and instruction to access the V-Safe system.   Mr. Victor Russo was instructed to call 911 with any severe reactions post vaccine: Marland Kitchen Difficulty breathing  . Swelling of face and throat  . A fast heartbeat  . A bad rash all over body  . Dizziness and weakness   Immunizations Administered    Name Date Dose VIS Date Route   Pfizer COVID-19 Vaccine 06/01/2019  9:18 AM 0.3 mL 02/15/2019 Intramuscular   Manufacturer: [employer], [employer]   Lot: [id number]   NDC: [address]-6

## 2019-06-22 ENCOUNTER — Ambulatory Visit: Payer: Medicaid Other

## 2019-06-22 ENCOUNTER — Ambulatory Visit: Payer: Medicaid Other | Attending: Internal Medicine

## 2019-06-22 DIAGNOSIS — Z23 Encounter for immunization: Secondary | ICD-10-CM

## 2020-03-20 ENCOUNTER — Other Ambulatory Visit: Payer: Self-pay

## 2020-03-20 DIAGNOSIS — N200 Calculus of kidney: Secondary | ICD-10-CM | POA: Diagnosis not present

## 2020-03-20 DIAGNOSIS — R109 Unspecified abdominal pain: Secondary | ICD-10-CM | POA: Diagnosis present

## 2020-03-20 LAB — CBC
HCT: 43.2 % (ref 39.0–52.0)
Hemoglobin: 14.4 g/dL (ref 13.0–17.0)
MCH: 29.4 pg (ref 26.0–34.0)
MCHC: 33.3 g/dL (ref 30.0–36.0)
MCV: 88.2 fL (ref 80.0–100.0)
Platelets: 314 10*3/uL (ref 150–400)
RBC: 4.9 MIL/uL (ref 4.22–5.81)
RDW: 12.3 % (ref 11.5–15.5)
WBC: 7.3 10*3/uL (ref 4.0–10.5)
nRBC: 0 % (ref 0.0–0.2)

## 2020-03-20 LAB — BASIC METABOLIC PANEL
Anion gap: 13 (ref 5–15)
BUN: 16 mg/dL (ref 6–20)
CO2: 26 mmol/L (ref 22–32)
Calcium: 9.1 mg/dL (ref 8.9–10.3)
Chloride: 102 mmol/L (ref 98–111)
Creatinine, Ser: 1.58 mg/dL — ABNORMAL HIGH (ref 0.61–1.24)
GFR, Estimated: 54 mL/min — ABNORMAL LOW (ref >60)
Glucose, Bld: 110 mg/dL — ABNORMAL HIGH (ref 70–99)
Potassium: 4 mmol/L (ref 3.5–5.1)
Sodium: 141 mmol/L (ref 135–145)

## 2020-03-20 LAB — URINALYSIS, COMPLETE (UACMP) WITH MICROSCOPIC
Bacteria, UA: NONE SEEN
Bilirubin Urine: NEGATIVE
Glucose, UA: NEGATIVE mg/dL
Ketones, ur: NEGATIVE mg/dL
Leukocytes,Ua: NEGATIVE
Nitrite: NEGATIVE
Protein, ur: NEGATIVE mg/dL
RBC / HPF: >50 RBC/hpf — ABNORMAL HIGH (ref 0–5)
Specific Gravity, Urine: 1.017 (ref 1.005–1.030)
Squamous Epithelial / LPF: NONE SEEN (ref 0–5)
pH: 5 (ref 5.0–8.0)

## 2020-03-20 NOTE — ED Triage Notes (Signed)
Pt states right sided flank pain that radiates towards the groin. Pt states pain started this morning and believes it is a kidney stone as he has had multiple in the past.

## 2020-03-21 ENCOUNTER — Emergency Department: Payer: Medicaid Other

## 2020-03-21 ENCOUNTER — Emergency Department
Admission: EM | Admit: 2020-03-21 | Discharge: 2020-03-21 | Disposition: A | Discharge status: Home / Self Care | Payer: Medicaid Other | Attending: Emergency Medicine | Admitting: Emergency Medicine

## 2020-03-21 DIAGNOSIS — N200 Calculus of kidney: Secondary | ICD-10-CM

## 2020-03-21 MED ORDER — ONDANSETRON 4 MG PO TBDP: 4.0000 mg | ORAL_TABLET | Freq: Three times a day (TID) | ORAL | 0 refills | Status: DC | PRN

## 2020-03-21 MED ORDER — OXYCODONE HCL 5 MG PO TABS: 5.0000 mg | ORAL_TABLET | Freq: Four times a day (QID) | ORAL | 0 refills | Status: AC | PRN

## 2020-03-21 MED ORDER — TAMSULOSIN HCL 0.4 MG PO CAPS: 0.4000 mg | ORAL_CAPSULE | Freq: Every day | ORAL | 0 refills | Status: AC

## 2020-03-21 MED ORDER — FLUCONAZOLE 100 MG PO TABS
150.0000 mg | ORAL_TABLET | Freq: Once | ORAL | Status: DC
  Filled 2020-03-21: qty 1

## 2020-03-21 MED ORDER — OXYCODONE HCL 5 MG PO TABS
5.0000 mg | ORAL_TABLET | Freq: Once | ORAL | Status: AC
  Administered 2020-03-21: 5 mg via ORAL
  Filled 2020-03-21: qty 1

## 2020-03-21 NOTE — ED Notes (Signed)
Pt requesting pain medication. Pt offered tylenol or motrin but states is allergic to both. Pt appears in no acute distress.

## 2020-03-21 NOTE — Discharge Instructions (Addendum)
Stay well-hydrated drink plenty of fluid.  Take the oxycodone to help with pain.  Do not drive or work while on this.  Take the tamsulosin to help dilate your urethra.  Take the Zofran to help with nausea.  Return to the ER for fevers unable to eat or other concerns.  Follow-up with urology if your pain is continuing and you are not able to pass the stone.  Your urine also showed yeast in it so we have given you a dose of antibiotics to treat this.  Your culture is pending and this will need to be followed up on MyChart in with urology  Urology should call you to make an appointment on Monday but if not there information is above  IMPRESSION: 1. Acute Obstructive Uropathy on the Right with a 4-5 mm ureteral calculus at the pelvic inlet. Punctate additional right nephrolithiasis.   2. Progressed Fatty Liver disease since 2019.

## 2020-03-21 NOTE — ED Provider Notes (Signed)
[facility] Emergency Department Provider Note  ____________________________________________   None    (approximate)  I have reviewed the triage vital signs and the nursing notes.   HISTORY  Chief Complaint Flank Pain    HPI Victor Russo is a 50 y.o. male with history of kidney stones who comes in for right flank pain.  Patient reports right flank pain that started yesterday.  The pain is severe, constant, nothing makes it better, nothing makes it worse.  The pain does radiate into his groin.  States he has had kidney stones before.  His largest kidney stone was 7 mm on the right side.  Denies ever needing surgery for it       Medical: Kidney stone   There are no problems to display for this patient.   Surgical: None  Prior to Admission medications   Medication Sig Start Date End Date Taking? Authorizing Provider  cyclobenzaprine (FLEXERIL) 5 MG tablet Take 1 tablet (5 mg total) by mouth 3 (three) times daily as needed for muscle spasms. 12/31/17   Menshew, Charlesetta Ivory, PA-C  ketorolac (TORADOL) 10 MG tablet Take 1 tablet (10 mg total) by mouth every 8 (eight) hours. 12/31/17   Menshew, Charlesetta Ivory, PA-C  ondansetron (ZOFRAN ODT) 4 MG disintegrating tablet Take 1 tablet (4 mg total) by mouth every 8 (eight) hours as needed for nausea or vomiting. 08/02/15   Irean Hong, MD  ondansetron (ZOFRAN ODT) 4 MG disintegrating tablet Take 1 tablet (4 mg total) by mouth every 8 (eight) hours as needed for nausea or vomiting. 07/01/17   Emily Filbert, MD  oxyCODONE (ROXICODONE) 5 MG immediate release tablet Take 1 tablet (5 mg total) by mouth every 4 (four) hours as needed for severe pain. 08/02/15   Irean Hong, MD  oxyCODONE (ROXICODONE) 5 MG immediate release tablet Take 1 tablet (5 mg total) by mouth every 4 (four) hours as needed for severe pain. 07/01/17   Emily Filbert, MD  tamsulosin (FLOMAX) 0.4 MG CAPS capsule Take 1 capsule (0.4 mg  total) by mouth daily after breakfast. 07/01/17   Emily Filbert, MD    Allergies Aspirin, Ibuprofen, and Tylenol [acetaminophen]  No family history on file.  Social History Social History   Tobacco Use  . Smoking status: Never Smoker  . Smokeless tobacco: Never Used  Substance Use Topics  . Alcohol use: No  . Drug use: No      Review of Systems Constitutional: No fever/chills Eyes: No visual changes. ENT: No sore throat. Cardiovascular: Denies chest pain. Respiratory: Denies shortness of breath. Gastrointestinal: No abdominal pain.  No nausea, no vomiting.  No diarrhea.  No constipation. Genitourinary: Negative for dysuria.  Right flank pain, right groin pain Musculoskeletal: Negative for back pain. Skin: Negative for rash. Neurological: Negative for headaches, focal weakness or numbness. All other ROS negative ____________________________________________   PHYSICAL EXAM:  VITAL SIGNS: ED Triage Vitals  Enc Vitals Group     BP 03/20/20 2054 (!) 168/118     Pulse Rate 03/20/20 2054 94     Resp 03/20/20 2054 18     Temp 03/20/20 2054 98.4 F (36.9 C)     Temp Source 03/20/20 2223 Oral     SpO2 03/20/20 2054 100 %     Weight 03/20/20 2055 200 lb (90.7 kg)     Height 03/20/20 2055 5\' 6"  (1.676 m)     Head Circumference --  Peak Flow --      Pain Score 03/20/20 2054 5     Pain Loc --      Pain Edu? --      Excl. in GC? --     Constitutional: Alert and oriented. Well appearing and in no acute distress. Eyes: Conjunctivae are normal. EOMI. Head: Atraumatic. Nose: No congestion/rhinnorhea. Mouth/Throat: Mucous membranes are moist.   Neck: No stridor. Trachea Midline. FROM Cardiovascular: Normal rate, regular rhythm. Grossly normal heart sounds.  Good peripheral circulation. Respiratory: Normal respiratory effort.  No retractions. Lungs CTAB. Gastrointestinal: Soft and nontender. No distention. No abdominal bruits.  Musculoskeletal: No lower  extremity tenderness nor edema.  No joint effusions. Neurologic:  Normal speech and language. No gross focal neurologic deficits are appreciated.  Skin:  Skin is warm, dry and intact. No rash noted. Psychiatric: Mood and affect are normal. Speech and behavior are normal. GU: Deferred  Tenderness to the right flank ____________________________________________   LABS (all labs ordered are listed, but only abnormal results are displayed)  Labs Reviewed  URINALYSIS, COMPLETE (UACMP) WITH MICROSCOPIC - Abnormal; Notable for the following components:      Result Value   Color, Urine YELLOW (*)    APPearance HAZY (*)    Hgb urine dipstick LARGE (*)    RBC / HPF >50 (*)    All other components within normal limits  BASIC METABOLIC PANEL - Abnormal; Notable for the following components:   Glucose, Bld 110 (*)    Creatinine, Ser 1.58 (*)    GFR, Estimated 54 (*)    All other components within normal limits  CBC   ____________________________________________   RADIOLOGY   Official radiology report(s): CT Renal Stone Study  Result Date: 03/21/2020 CLINICAL DATA:  50 year old male with right flank pain radiating to the groin since yesterday. History of prior renal stones. EXAM: CT ABDOMEN AND PELVIS WITHOUT CONTRAST TECHNIQUE: Multidetector CT imaging of the abdomen and pelvis was performed following the standard protocol without IV contrast. COMPARISON:  Noncontrast CT Abdomen and Pelvis 07/01/2017 and earlier. FINDINGS: Lower chest: Negative; mild-to-moderate respiratory motion. Hepatobiliary: Progressed hepatic steatosis since 2019 (series 2, image 24). Mild focal fatty sparing near the gallbladder fossa. Negative gallbladder. Pancreas: Negative. Spleen: Negative. Adrenals/Urinary Tract: Normal adrenal glands. Negative noncontrast left kidney and ureter. Right nephromegaly with right hydronephrosis and right hydroureter. Obstructing 4-5 mm calculus in the right ureter as it crosses the iliac  vessels into the pelvis on series 2, image 69, coronal image 105. Mild to moderate right hydronephrosis and periureteral stranding. Distal to the stone the right ureter is normal to the bladder. Punctate additional right lower pole nephrolithiasis. Bladder is diminutive and negative. Stomach/Bowel: Negative. Normal appendix. No free air, free fluid, mesenteric stranding. Vascular/Lymphatic: Normal caliber abdominal aorta. Minimal calcified atherosclerosis. Vascular patency is not evaluated in the absence of IV contrast. No lymphadenopathy. Reproductive: Negative. Other: No pelvic free fluid. Musculoskeletal: Stable, negative. IMPRESSION: 1. Acute Obstructive Uropathy on the Right with a 4-5 mm ureteral calculus at the pelvic inlet. Punctate additional right nephrolithiasis. 2. Progressed Fatty Liver disease since 2019. Electronically Signed   By: Odessa Fleming M.D.   On: 03/21/2020 06:18    ____________________________________________   PROCEDURES  Procedure(s) performed (including Critical Care):  Procedures   ____________________________________________   INITIAL IMPRESSION / ASSESSMENT AND PLAN / ED COURSE  Victor Russo was evaluated in Emergency Department on 03/21/2020 for the symptoms described in the history of present illness. He was evaluated in the  context of the global COVID-19 pandemic, which necessitated consideration that the patient might be at risk for infection with the SARS-CoV-2 virus that causes COVID-19. Institutional protocols and algorithms that pertain to the evaluation of patients at risk for COVID-19 are in a state of rapid change based on information released by regulatory bodies including the CDC and federal and state organizations. These policies and algorithms were followed during the patient's care in the ED.    Patient is a well-appearing 50 year old who comes in with right flank pain radiating to his groin.  Patient reports a history of kidney stones.  I suspect this is  most likely related to kidney stone.  Consider appendicitis, SBO, UTI.  CT scan was ordered in triage.  Patient is awaiting for over 11 hours.  CT scan resulted with kidney stone on the right that is obstructed.  Patient is no signs of septic stone.  He is afebrile with normal white count.  His creatinine is slightly elevated 1.5.  He is otherwise very well-appearing.  We discussed return precautions including fevers, chills and explained to patient that he would need surgery if this was to happen.  However he has passed larger stones in the right before so I suspect he will be able to pass this.  I did message Dr. Annabell Howells who sent him up for appointment with urology.  His urine did show yeast.  I discussed with Dr. Annabell Howells who recommended that we treat with 1 dose of flucan and send for urine culture.   ____________________________________________   FINAL CLINICAL IMPRESSION(S) / ED DIAGNOSES   Final diagnoses:  Kidney stone      MEDICATIONS GIVEN DURING THIS VISIT:  Medications  fluconazole (DIFLUCAN) tablet 150 mg (has no administration in time range)  oxyCODONE (Oxy IR/ROXICODONE) immediate release tablet 5 mg (5 mg Oral Given 03/21/20 0747)     ED Discharge Orders         Ordered    oxyCODONE (ROXICODONE) 5 MG immediate release tablet  Every 6 hours PRN        03/21/20 0748    ondansetron (ZOFRAN ODT) 4 MG disintegrating tablet  Every 8 hours PRN        03/21/20 0748    tamsulosin (FLOMAX) 0.4 MG CAPS capsule  Daily        03/21/20 0748           Note:  This document was prepared using Dragon voice recognition software and may include unintentional dictation errors.   Concha Se, MD 03/21/20 [phone]

## 2020-03-22 LAB — URINE CULTURE: Culture: NO GROWTH

## 2020-03-24 ENCOUNTER — Telehealth: Payer: Self-pay | Admitting: Urology

## 2020-03-24 NOTE — Telephone Encounter (Signed)
-----   Message from Bjorn Pippin, MD sent at 03/21/2020  7:43 AM EST ----- This fellow needs f/u for a ureteral stone.  Seen in the ED on 1/15.

## 2020-03-24 NOTE — Telephone Encounter (Signed)
Called patient to schedule an ER follow up Could not leave a message as his voicemail was full  Victor Russo

## 2020-03-25 NOTE — Telephone Encounter (Signed)
I reached the patient and he has Medicaid and it's one that we do not accept. He is going to call them and find a provider that is in his network and go see them.

## 2020-03-26 ENCOUNTER — Ambulatory Visit: Payer: Self-pay | Admitting: Urology

## 2020-10-30 ENCOUNTER — Other Ambulatory Visit: Payer: Self-pay

## 2020-10-30 ENCOUNTER — Ambulatory Visit (INDEPENDENT_AMBULATORY_CARE_PROVIDER_SITE_OTHER): Payer: 59 | Admitting: Cardiology

## 2020-10-30 ENCOUNTER — Encounter: Payer: Self-pay | Admitting: Cardiology

## 2020-10-30 VITALS — BP 136/92 | HR 74 | Ht 66.0 in | Wt 205.0 lb

## 2020-10-30 DIAGNOSIS — Z13220 Encounter for screening for lipoid disorders: Secondary | ICD-10-CM

## 2020-10-30 DIAGNOSIS — Z7189 Other specified counseling: Secondary | ICD-10-CM

## 2020-10-30 DIAGNOSIS — R0602 Shortness of breath: Secondary | ICD-10-CM

## 2020-10-30 DIAGNOSIS — I10 Essential (primary) hypertension: Secondary | ICD-10-CM | POA: Diagnosis not present

## 2020-10-30 MED ORDER — IRBESARTAN 75 MG PO TABS: 75.0000 mg | ORAL_TABLET | Freq: Every day | ORAL | 3 refills | Status: DC

## 2020-10-30 NOTE — Progress Notes (Signed)
Cardiology Office Note:    Date:  10/30/2020   ID:  Victor Russo, DOB 1970-09-24, MRN [id number]  PCP:  Patient, No Pcp Per (Inactive)   [facility] Providers Cardiologist:  Debbe Odea, MD     Referring MD: No ref. provider found   Chief Complaint  Patient presents with   New Patient (Initial Visit)    Self referral -- Patient c.o elevated BP. Meds reviewed verbally with patient.     History of Present Illness:    Victor Russo is a 50 y.o. male with no significant past medical history presents due to elevated blood pressures.  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  Denies chest pain with walking.  He has also noticed elevated blood pressures ranging in the 130s to 140s over the past several weeks.  Denies smoking, mother passed from heart attack in his 57s.  Has not seen a primary care physician for long time now.  Recently moved from [place].  Planning on getting a primary care physician in the area.  History reviewed. No pertinent past medical history.  History reviewed. No pertinent surgical history.  Current Medications: Current Meds  Medication Sig   cyclobenzaprine (FLEXERIL) 5 MG tablet Take 1 tablet (5 mg total) by mouth 3 (three) times daily as needed for muscle spasms.   irbesartan (AVAPRO) 75 MG tablet Take 1 tablet (75 mg total) by mouth daily.   ketorolac (TORADOL) 10 MG tablet Take 1 tablet (10 mg total) by mouth every 8 (eight) hours.   ondansetron (ZOFRAN ODT) 4 MG disintegrating tablet Take 1 tablet (4 mg total) by mouth every 8 (eight) hours as needed for nausea or vomiting.     Allergies:   Aspirin, Ibuprofen, and Tylenol [acetaminophen]   Social History   Socioeconomic History   Marital status: Married    Spouse name: Not on file   Number of children: Not on file   Years of education: Not on file   Highest education level: Not on file  Occupational History   Not on file  Tobacco Use   Smoking  status: Never   Smokeless tobacco: Never  Substance and Sexual Activity   Alcohol use: No   Drug use: No   Sexual activity: Not on file  Other Topics Concern   Not on file  Social History Narrative   Not on file   Social Determinants of Health   Financial Resource Strain: Not on file  Food Insecurity: Not on file  Transportation Needs: Not on file  Physical Activity: Not on file  Stress: Not on file  Social Connections: Not on file     Family History: The patient's family history is not on file.  ROS:   Please see the history of present illness.     All other systems reviewed and are negative.  EKGs/Labs/Other Studies Reviewed:    The following studies were reviewed today:   EKG:  EKG is  ordered today.  The ekg ordered today demonstrates normal sinus rhythm, normal ECG.  Recent Labs: 03/20/2020: BUN 16; Creatinine, Ser 1.58; Hemoglobin 14.4; Platelets 314; Potassium 4.0; Sodium 141  Recent Lipid Panel No results found for: CHOL, TRIG, HDL, CHOLHDL, VLDL, LDLCALC, LDLDIRECT   Risk Assessment/Calculations:          Physical Exam:    VS:  BP (!) 136/92 (BP Location: Left Arm, Patient Position: Sitting, Cuff Size: Normal)   Pulse 74   Ht 5'  6" (1.676 m)   Wt 205 lb (93 kg)   SpO2 95%   BMI 33.09 kg/m     Wt Readings from Last 3 Encounters:  10/30/20 205 lb (93 kg)  03/20/20 200 lb (90.7 kg)  07/01/17 180 lb (81.6 kg)     GEN:  Well nourished, well developed in no acute distress HEENT: Normal NECK: No JVD; No carotid bruits LYMPHATICS: No lymphadenopathy CARDIAC: RRR, no murmurs, rubs, gallops RESPIRATORY:  Clear to auscultation without rales, wheezing or rhonchi  ABDOMEN: Soft, non-tender, non-distended MUSCULOSKELETAL:  No edema; No deformity  SKIN: Warm and dry NEUROLOGIC:  Alert and oriented x 3 PSYCHIATRIC:  Normal affect   ASSESSMENT:    1. Primary hypertension   2. SOB (shortness of breath)   3. Cardiac risk counseling   4. Lipid  screening    PLAN:    In order of problems listed above:  Hypertension, start irbesartan 75 mg daily.  Low-salt diet, exercise, weight loss advised.  Check BP daily and keep a log.  Titrate BP meds as needed. Shortness of breath, obtain echo to evaluate systolic and diastolic function.  Symptoms do not seem to be an anginal equivalent. Patient wants to get her cholesterol checked.  Fasting lipid profile obtained.  Follow-up after echocardiogram.    Medication Adjustments/Labs and Tests Ordered: Current medicines are reviewed at length with the patient today.  Concerns regarding medicines are outlined above.  Orders Placed This Encounter  Procedures   Lipid panel   EKG 12-Lead   ECHOCARDIOGRAM COMPLETE    Meds ordered this encounter  Medications   irbesartan (AVAPRO) 75 MG tablet    Sig: Take 1 tablet (75 mg total) by mouth daily.    Dispense:  30 tablet    Refill:  3     Patient Instructions  Medication Instructions:   Your physician has recommended you make the following change in your medication:    START taking Irbesartan 75 MG once a day.  *If you need a refill on your cardiac medications before your next appointment, please call your pharmacy*   Lab Work:  Fasting Lipid drawn in office today.  If you have labs (blood work) drawn today and your tests are completely normal, you will receive your results only by: MyChart Message (if you have MyChart) OR A paper copy in the mail If you have any lab test that is abnormal or we need to change your treatment, we will call you to review the results.   Testing/Procedures:  Your physician has requested that you have an echocardiogram. Echocardiography is a painless test that uses sound waves to create images of your heart. It provides your doctor with information about the size and shape of your heart and how well your heart's chambers and valves are working. This procedure takes approximately one hour. There are no  restrictions for this procedure.    Follow-Up: At [facility], you and your health needs are our priority.  As part of our continuing mission to provide you with exceptional heart care, we have created designated Provider Care Teams.  These Care Teams include your primary Cardiologist (physician) and Advanced Practice Providers (APPs -  Physician Assistants and Nurse Practitioners) who all work together to provide you with the care you need, when you need it.  We recommend signing up for the patient portal called "MyChart".  Sign up information is provided on this After Visit Summary.  MyChart is used to connect with patients for Virtual  Visits (Telemedicine).  Patients are able to view lab/test results, encounter notes, upcoming appointments, etc.  Non-urgent messages can be sent to your provider as well.   To learn more about what you can do with MyChart, go to [email or web address].    Your next appointment:   Follow up after Echo   The format for your next appointment:   In Person  Provider:   Debbe Odea, MD   Other Instructions    Signed, Debbe Odea, MD  10/30/2020 10:41 AM    [facility]

## 2020-10-30 NOTE — Patient Instructions (Signed)
Medication Instructions:   Your physician has recommended you make the following change in your medication:    START taking Irbesartan 75 MG once a day.  *If you need a refill on your cardiac medications before your next appointment, please call your pharmacy*   Lab Work:  Fasting Lipid drawn in office today.  If you have labs (blood work) drawn today and your tests are completely normal, you will receive your results only by: MyChart Message (if you have MyChart) OR A paper copy in the mail If you have any lab test that is abnormal or we need to change your treatment, we will call you to review the results.   Testing/Procedures:  Your physician has requested that you have an echocardiogram. Echocardiography is a painless test that uses sound waves to create images of your heart. It provides your doctor with information about the size and shape of your heart and how well your heart's chambers and valves are working. This procedure takes approximately one hour. There are no restrictions for this procedure.    Follow-Up: At [facility], you and your health needs are our priority.  As part of our continuing mission to provide you with exceptional heart care, we have created designated Provider Care Teams.  These Care Teams include your primary Cardiologist (physician) and Advanced Practice Providers (APPs -  Physician Assistants and Nurse Practitioners) who all work together to provide you with the care you need, when you need it.  We recommend signing up for the patient portal called "MyChart".  Sign up information is provided on this After Visit Summary.  MyChart is used to connect with patients for Virtual Visits (Telemedicine).  Patients are able to view lab/test results, encounter notes, upcoming appointments, etc.  Non-urgent messages can be sent to your provider as well.   To learn more about what you can do with MyChart, go to [email or web address].    Your next appointment:    Follow up after Echo   The format for your next appointment:   In Person  Provider:   Debbe Odea, MD   Other Instructions

## 2020-10-31 LAB — LIPID PANEL
Chol/HDL Ratio: 4.9 ratio (ref 0.0–5.0)
Cholesterol, Total: 168 mg/dL (ref 100–199)
HDL: 34 mg/dL — ABNORMAL LOW (ref >39)
LDL Chol Calc (NIH): 102 mg/dL — ABNORMAL HIGH (ref 0–99)
Triglycerides: 180 mg/dL — ABNORMAL HIGH (ref 0–149)
VLDL Cholesterol Cal: 32 mg/dL (ref 5–40)

## 2020-11-23 ENCOUNTER — Other Ambulatory Visit: Payer: Self-pay

## 2020-11-23 ENCOUNTER — Ambulatory Visit (INDEPENDENT_AMBULATORY_CARE_PROVIDER_SITE_OTHER): Payer: 59

## 2020-11-23 DIAGNOSIS — R0602 Shortness of breath: Secondary | ICD-10-CM

## 2020-11-23 LAB — ECHOCARDIOGRAM COMPLETE
AR max vel: 2.22 cm2
AV Area VTI: 2.23 cm2
AV Area mean vel: 2.16 cm2
AV Mean grad: 3 mmHg
AV Peak grad: 4.4 mmHg
Ao pk vel: 1.05 m/s
Area-P 1/2: 3.16 cm2
MV VTI: 2.01 cm2
S' Lateral: 2.5 cm

## 2020-11-23 MED ORDER — PERFLUTREN LIPID MICROSPHERE
1.0000 mL | INTRAVENOUS | Status: AC | PRN
  Administered 2020-11-23: 3 mL via INTRAVENOUS

## 2020-12-07 ENCOUNTER — Other Ambulatory Visit: Payer: Self-pay

## 2020-12-07 ENCOUNTER — Encounter: Payer: Self-pay | Admitting: Cardiology

## 2020-12-07 ENCOUNTER — Ambulatory Visit (INDEPENDENT_AMBULATORY_CARE_PROVIDER_SITE_OTHER): Payer: 59 | Admitting: Cardiology

## 2020-12-07 VITALS — BP 120/80 | HR 92 | Ht 65.0 in | Wt 204.0 lb

## 2020-12-07 DIAGNOSIS — E782 Mixed hyperlipidemia: Secondary | ICD-10-CM | POA: Diagnosis not present

## 2020-12-07 DIAGNOSIS — R0602 Shortness of breath: Secondary | ICD-10-CM

## 2020-12-07 DIAGNOSIS — I10 Essential (primary) hypertension: Secondary | ICD-10-CM

## 2020-12-07 MED ORDER — IRBESARTAN 75 MG PO TABS: 75.0000 mg | ORAL_TABLET | Freq: Every day | ORAL | 3 refills | Status: DC

## 2020-12-07 NOTE — Patient Instructions (Signed)

## 2020-12-07 NOTE — Progress Notes (Signed)
Cardiology Office Note:    Date:  12/07/2020   ID:  Victor Russo, DOB 05/02/1970, MRN [id number]  PCP:  Patient, No Pcp Per (Inactive)   [facility] Providers Cardiologist:  Debbe Odea, MD     Referring MD: Debbe Odea, MD   Chief Complaint  Patient presents with   Follow-up    F/U after echo    History of Present Illness:    Victor Russo is a 50 y.o. male with history of hyperlipidemia, hypertension who presents for follow-up.  Previously seen due to elevated blood pressures .   He was started on irbesartan.  Fasting lipid profile also ordered to evaluate cholesterol levels.  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  Echocardiogram was ordered to evaluate any significant structural abnormalities.  He shortness of breath is better, his blood pressure is also better.  States feeling dizzy a little bit after starting medicine but overall that has resolved.   Past Medical History:  Diagnosis Date   Hypertension     History reviewed. No pertinent surgical history.  Current Medications: Current Meds  Medication Sig   [DISCONTINUED] irbesartan (AVAPRO) 75 MG tablet Take 1 tablet (75 mg total) by mouth daily.     Allergies:   Aspirin, Ibuprofen, and Tylenol [acetaminophen]   Social History   Socioeconomic History   Marital status: Married    Spouse name: Not on file   Number of children: Not on file   Years of education: Not on file   Highest education level: Not on file  Occupational History   Not on file  Tobacco Use   Smoking status: Never   Smokeless tobacco: Never  Vaping Use   Vaping Use: Never used  Substance and Sexual Activity   Alcohol use: No   Drug use: No   Sexual activity: Not on file  Other Topics Concern   Not on file  Social History Narrative   Not on file   Social Determinants of Health   Financial Resource Strain: Not on file  Food Insecurity: Not on file  Transportation Needs: Not on file  Physical  Activity: Not on file  Stress: Not on file  Social Connections: Not on file     Family History: The patient's family history includes Heart attack in his father; Hypertension in his sister.  ROS:   Please see the history of present illness.     All other systems reviewed and are negative.  EKGs/Labs/Other Studies Reviewed:    The following studies were reviewed today:   EKG:  EKG not  ordered today.   Recent Labs: 03/20/2020: BUN 16; Creatinine, Ser 1.58; Hemoglobin 14.4; Platelets 314; Potassium 4.0; Sodium 141  Recent Lipid Panel    Component Value Date/Time   CHOL 168 10/30/2020 0849   TRIG 180 (H) 10/30/2020 0849   HDL 34 (L) 10/30/2020 0849   CHOLHDL 4.9 10/30/2020 0849   LDLCALC 102 (H) 10/30/2020 0849     Risk Assessment/Calculations:          Physical Exam:    VS:  BP 120/80 (BP Location: Left Arm, Patient Position: Sitting, Cuff Size: Large)   Pulse 92   Ht 5\' 5"  (1.651 m)   Wt 204 lb (92.5 kg)   SpO2 98%   BMI 33.95 kg/m     Wt Readings from Last 3 Encounters:  12/07/20 204 lb (92.5 kg)  10/30/20 205 lb (93 kg)  03/20/20 200 lb (90.7 kg)     GEN:  Well nourished, well developed in no acute distress HEENT: Normal NECK: No JVD; No carotid bruits LYMPHATICS: No lymphadenopathy CARDIAC: RRR, no murmurs, rubs, gallops RESPIRATORY:  Clear to auscultation without rales, wheezing or rhonchi  ABDOMEN: Soft, non-tender, non-distended MUSCULOSKELETAL:  No edema; No deformity  SKIN: Warm and dry NEUROLOGIC:  Alert and oriented x 3 PSYCHIATRIC:  Normal affect   ASSESSMENT:    1. Primary hypertension   2. SOB (shortness of breath)   3. Mixed hyperlipidemia     PLAN:    In order of problems listed above:  Hypertension, BP appears controlled.  Continue irbesartan 75 mg daily. Shortness of breath, echo showed normal systolic and diastolic function, EF 55 to 60%. Mixed hyperlipidemia, 10-year ASCVD risk 3.5% elevated triglycerides.  Not in statin  benefit group, low-cholesterol diet advised.  Follow-up as needed.    Medication Adjustments/Labs and Tests Ordered: Current medicines are reviewed at length with the patient today.  Concerns regarding medicines are outlined above.  No orders of the defined types were placed in this encounter.   Meds ordered this encounter  Medications   irbesartan (AVAPRO) 75 MG tablet    Sig: Take 1 tablet (75 mg total) by mouth daily.    Dispense:  90 tablet    Refill:  3      Patient Instructions  Medication Instructions:  Your physician recommends that you continue on your current medications as directed. Please refer to the Current Medication list given to you today.  *If you need a refill on your cardiac medications before your next appointment, please call your pharmacy*   Lab Work: None ordered If you have labs (blood work) drawn today and your tests are completely normal, you will receive your results only by: MyChart Message (if you have MyChart) OR A paper copy in the mail If you have any lab test that is abnormal or we need to change your treatment, we will call you to review the results.   Testing/Procedures: None ordered   Follow-Up: At [facility], you and your health needs are our priority.  As part of our continuing mission to provide you with exceptional heart care, we have created designated Provider Care Teams.  These Care Teams include your primary Cardiologist (physician) and Advanced Practice Providers (APPs -  Physician Assistants and Nurse Practitioners) who all work together to provide you with the care you need, when you need it.  We recommend signing up for the patient portal called "MyChart".  Sign up information is provided on this After Visit Summary.  MyChart is used to connect with patients for Virtual Visits (Telemedicine).  Patients are able to view lab/test results, encounter notes, upcoming appointments, etc.  Non-urgent messages can be sent to your  provider as well.   To learn more about what you can do with MyChart, go to [email or web address].    Your next appointment:   Follow up as needed   The format for your next appointment:   In Person  Provider:   Debbe Odea, MD   Other Instructions    Signed, Debbe Odea, MD  12/07/2020 12:24 PM    [facility]

## 2021-03-11 ENCOUNTER — Other Ambulatory Visit: Payer: Self-pay

## 2021-03-11 MED ORDER — IRBESARTAN 75 MG PO TABS: 75.0000 mg | ORAL_TABLET | Freq: Every day | ORAL | 0 refills | Status: DC

## 2021-08-18 ENCOUNTER — Emergency Department
Admission: EM | Admit: 2021-08-18 | Discharge: 2021-08-18 | Disposition: A | Discharge status: Home / Self Care | Payer: Medicaid Other | Attending: Emergency Medicine | Admitting: Emergency Medicine

## 2021-08-18 ENCOUNTER — Encounter: Payer: Self-pay | Admitting: Emergency Medicine

## 2021-08-18 ENCOUNTER — Emergency Department: Payer: Medicaid Other

## 2021-08-18 DIAGNOSIS — R350 Frequency of micturition: Secondary | ICD-10-CM | POA: Insufficient documentation

## 2021-08-18 DIAGNOSIS — I10 Essential (primary) hypertension: Secondary | ICD-10-CM | POA: Diagnosis not present

## 2021-08-18 DIAGNOSIS — N200 Calculus of kidney: Secondary | ICD-10-CM | POA: Insufficient documentation

## 2021-08-18 DIAGNOSIS — R109 Unspecified abdominal pain: Secondary | ICD-10-CM | POA: Diagnosis present

## 2021-08-18 DIAGNOSIS — R7401 Elevation of levels of liver transaminase levels: Secondary | ICD-10-CM | POA: Diagnosis not present

## 2021-08-18 DIAGNOSIS — N401 Enlarged prostate with lower urinary tract symptoms: Secondary | ICD-10-CM | POA: Diagnosis not present

## 2021-08-18 DIAGNOSIS — N132 Hydronephrosis with renal and ureteral calculous obstruction: Secondary | ICD-10-CM | POA: Diagnosis not present

## 2021-08-18 DIAGNOSIS — K760 Fatty (change of) liver, not elsewhere classified: Secondary | ICD-10-CM | POA: Insufficient documentation

## 2021-08-18 DIAGNOSIS — R748 Abnormal levels of other serum enzymes: Secondary | ICD-10-CM

## 2021-08-18 DIAGNOSIS — N3289 Other specified disorders of bladder: Secondary | ICD-10-CM | POA: Diagnosis not present

## 2021-08-18 DIAGNOSIS — Z87442 Personal history of urinary calculi: Secondary | ICD-10-CM | POA: Insufficient documentation

## 2021-08-18 LAB — CBC
HCT: 46.1 % (ref 39.0–52.0)
Hemoglobin: 15 g/dL (ref 13.0–17.0)
MCH: 29.4 pg (ref 26.0–34.0)
MCHC: 32.5 g/dL (ref 30.0–36.0)
MCV: 90.2 fL (ref 80.0–100.0)
Platelets: 322 10*3/uL (ref 150–400)
RBC: 5.11 MIL/uL (ref 4.22–5.81)
RDW: 12.6 % (ref 11.5–15.5)
WBC: 10.2 10*3/uL (ref 4.0–10.5)
nRBC: 0 % (ref 0.0–0.2)

## 2021-08-18 LAB — URINALYSIS, ROUTINE W REFLEX MICROSCOPIC
Bacteria, UA: NONE SEEN
Bilirubin Urine: NEGATIVE
Glucose, UA: NEGATIVE mg/dL
Ketones, ur: 5 mg/dL — AB
Leukocytes,Ua: NEGATIVE
Nitrite: NEGATIVE
Protein, ur: 30 mg/dL — AB
Specific Gravity, Urine: 1.023 (ref 1.005–1.030)
pH: 5 (ref 5.0–8.0)

## 2021-08-18 LAB — COMPREHENSIVE METABOLIC PANEL
ALT: 97 U/L — ABNORMAL HIGH (ref 0–44)
AST: 58 U/L — ABNORMAL HIGH (ref 15–41)
Albumin: 3.8 g/dL (ref 3.5–5.0)
Alkaline Phosphatase: 82 U/L (ref 38–126)
Anion gap: 9 (ref 5–15)
BUN: 16 mg/dL (ref 6–20)
CO2: 25 mmol/L (ref 22–32)
Calcium: 8.8 mg/dL — ABNORMAL LOW (ref 8.9–10.3)
Chloride: 107 mmol/L (ref 98–111)
Creatinine, Ser: 1.22 mg/dL (ref 0.61–1.24)
GFR, Estimated: >60 mL/min (ref >60)
Glucose, Bld: 127 mg/dL — ABNORMAL HIGH (ref 70–99)
Potassium: 3.9 mmol/L (ref 3.5–5.1)
Sodium: 141 mmol/L (ref 135–145)
Total Bilirubin: 0.8 mg/dL (ref 0.3–1.2)
Total Protein: 7.6 g/dL (ref 6.5–8.1)

## 2021-08-18 LAB — LIPASE, BLOOD: Lipase: 32 U/L (ref 11–51)

## 2021-08-18 MED ORDER — ONDANSETRON HCL 4 MG/2ML IJ SOLN
4.0000 mg | Freq: Once | INTRAMUSCULAR | Status: AC
  Administered 2021-08-18: 4 mg via INTRAVENOUS
  Filled 2021-08-18: qty 2

## 2021-08-18 MED ORDER — OXYCODONE HCL 5 MG PO TABS
5.0000 mg | ORAL_TABLET | Freq: Once | ORAL | Status: DC
  Filled 2021-08-18: qty 1

## 2021-08-18 MED ORDER — FENTANYL CITRATE PF 50 MCG/ML IJ SOSY
50.0000 ug | PREFILLED_SYRINGE | Freq: Once | INTRAMUSCULAR | Status: AC
  Administered 2021-08-18: 50 ug via INTRAVENOUS
  Filled 2021-08-18: qty 1

## 2021-08-18 NOTE — ED Triage Notes (Signed)
Pt c/o sudden onset of left flank pain this AM at 0200 with nausea and urinary frequency. Hx/o Kidney Stones

## 2021-08-18 NOTE — Discharge Instructions (Addendum)
As we discussed, make sure to follow-up with your primary care doctor for further evaluation of your elevated liver enzymes.

## 2021-08-18 NOTE — ED Notes (Signed)
Patient transported to CT 

## 2021-08-18 NOTE — ED Provider Notes (Signed)
[facility] Provider Note    Event Date/Time   First MD Initiated Contact with Patient 08/18/21 [phone]     (approximate)   History   Flank Pain   HPI  Victor Russo is a 51 y.o. male with a history of kidney stones and hypertension who presents for evaluation of flank pain.  Patient reports sudden onset of sharp left-sided flank pain radiating to the lower abdomen associated with urinary frequency and nausea.  Has had kidney stones in the past and the pain is similar.  No fever or chills, no chest pain or shortness of breath, no dysuria or hematuria     Past Medical History:  Diagnosis Date   Hypertension     History reviewed. No pertinent surgical history.   Physical Exam   Triage Vital Signs: ED Triage Vitals  Enc Vitals Group     BP 08/18/21 0309 132/90     Pulse Rate 08/18/21 0309 93     Resp 08/18/21 0309 (!) 21     Temp 08/18/21 0309 98.5 F (36.9 C)     Temp Source 08/18/21 0309 Oral     SpO2 08/18/21 0309 98 %     Weight 08/18/21 0308 200 lb (90.7 kg)     Height 08/18/21 0308 5\' 3"  (1.6 m)     Head Circumference --      Peak Flow --      Pain Score 08/18/21 0431 0     Pain Loc --      Pain Edu? --      Excl. in GC? --     Most recent vital signs: Vitals:   08/18/21 0431 08/18/21 0508  BP: 127/85 122/85  Pulse: 74 65  Resp: 15 14  Temp:    SpO2: 96% 97%     Constitutional: Alert and oriented. Well appearing and in no apparent distress. HEENT:      Head: Normocephalic and atraumatic.         Eyes: Conjunctivae are normal. Sclera is non-icteric.       Mouth/Throat: Mucous membranes are moist.       Neck: Supple with no signs of meningismus. Cardiovascular: Regular rate and rhythm. No murmurs, gallops, or rubs. 2+ symmetrical distal pulses are present in all extremities.  Respiratory: Normal respiratory effort. Lungs are clear to auscultation bilaterally.  Gastrointestinal: Soft, non tender, and non distended with positive  bowel sounds. No rebound or guarding. Genitourinary: No CVA tenderness. Musculoskeletal:  No edema, cyanosis, or erythema of extremities. Neurologic: Normal speech and language. Face is symmetric. Moving all extremities. No gross focal neurologic deficits are appreciated. Skin: Skin is warm, dry and intact. No rash noted. Psychiatric: Mood and affect are normal. Speech and behavior are normal.  ED Results / Procedures / Treatments   Labs (all labs ordered are listed, but only abnormal results are displayed) Labs Reviewed  COMPREHENSIVE METABOLIC PANEL - Abnormal; Notable for the following components:      Result Value   Glucose, Bld 127 (*)    Calcium 8.8 (*)    AST 58 (*)    ALT 97 (*)    All other components within normal limits  URINALYSIS, ROUTINE W REFLEX MICROSCOPIC - Abnormal; Notable for the following components:   Color, Urine YELLOW (*)    APPearance HAZY (*)    Hgb urine dipstick LARGE (*)    Ketones, ur 5 (*)    Protein, ur 30 (*)    All other components  within normal limits  LIPASE, BLOOD  CBC     EKG  none   RADIOLOGY I, Nita Sickle, attending MD, have personally viewed and interpreted the images obtained during this visit as below:  CT showing mild left-sided hydroureteronephrosis with a stone in the bladder   ___________________________________________________ Interpretation by Radiologist:  CT Renal Stone Study  Result Date: 08/18/2021 CLINICAL DATA:  Flank pain, kidney stone suspected. EXAM: CT ABDOMEN AND PELVIS WITHOUT CONTRAST TECHNIQUE: Multidetector CT imaging of the abdomen and pelvis was performed following the standard protocol without IV contrast. RADIATION DOSE REDUCTION: This exam was performed according to the departmental dose-optimization program which includes automated exposure control, adjustment of the mA and/or kV according to patient size and/or use of iterative reconstruction technique. COMPARISON:  CT without contrast  03/21/2020 FINDINGS: Lower chest: No acute abnormality. Hepatobiliary: The liver is 20 cm length with moderate to severe steatosis. Gallbladder and bile ducts are unremarkable. No liver masses seen without contrast. Pancreas: No focal abnormality. Spleen: No focal abnormality or splenomegaly. Adrenals/Urinary Tract: The unenhanced adrenal glands and renal cortex are unremarkable. There are scattered punctate nonobstructive caliceal stones in the right kidney. On the left there is no intrarenal stone. There is mild left hydroureteronephrosis to the bladder base. No left ureteral stone is seen. There is a 2 mm stone in the bladder right of the midline which could be simply within the bladder lumen having passed from the left, or possibly lodged in the intravesical orifice of the right UVJ. On the right there is no hydroureteronephrosis. The bladder wall is unremarkable for the degree of distention. Stomach/Bowel: No dilatation or wall thickening including of the appendix. There are scattered left colonic diverticula without evidence of diverticulitis. Vascular/Lymphatic: No significant vascular findings are present. No enlarged abdominal or pelvic lymph nodes. Reproductive: Enlarged prostate, measures 4.8 cm transverse. Other: There is no free air, hemorrhage or fluid. There are small umbilical and inguinal fat hernias. There is no incarcerated hernia. Musculoskeletal: No acute or significant osseous findings. IMPRESSION: 1. Mild left hydroureteronephrosis.  No left ureteral stone. 2. 2 mm calcification in the bladder which could be free in the posterior lumen to the right or could less likely be lodged in the intravesical orifice of the right UVJ. There is no right hydronephrosis. 3. Nonobstructive right-sided micronephrolithiasis. No intrarenal stone on the left. 4. Moderate to severe hepatic steatosis. Correlate clinically for steatohepatitis. 5. Diverticulosis without diverticulitis. 6. Umbilical and inguinal fat  hernias. 7. Prostatomegaly. Electronically Signed   By: Almira Bar M.D.   On: 08/18/2021 03:44       PROCEDURES:  Critical Care performed: No  Procedures    IMPRESSION / MDM / ASSESSMENT AND PLAN / ED COURSE  I reviewed the triage vital signs and the nursing notes.  51 y.o. male with a history of kidney stones and hypertension who presents for evaluation of flank pain.  Patient is well-appearing in no distress with normal vital signs.  Abdomen soft and nontender.  Ddx: Kidney stone versus pyelonephritis versus UTI versus hernia versus diverticulitis   Plan: CBC, CMP, lipase, urinalysis, CT renal.  We will give him a dose of fentanyl   MEDICATIONS GIVEN IN ED: Medications  fentaNYL (SUBLIMAZE) injection 50 mcg (50 mcg Intravenous Given 08/18/21 0437)  ondansetron (ZOFRAN) injection 4 mg (4 mg Intravenous Given 08/18/21 0435)     ED COURSE: CT is consistent with a past stone and mild left-sided had ureteral nephrosis.  UA with blood but no signs  of UTI consistent with a passed stone.  Mildly elevated LFTs of unknown significance which is new when compared to prior.  Recommended follow-up with PCP for that.  No acute kidney injury.  Patient's pain is well controlled.  He is got oxycodone at home from prior kidney stones.  With the past stone I feel that he is stable for discharge home.  Discussed my standard return precautions and follow-up   Consults: none   EMR reviewed including records from his last visit with his cardiologist from October 2022 for hypertension    FINAL CLINICAL IMPRESSION(S) / ED DIAGNOSES   Final diagnoses:  Kidney stone  Elevated liver enzymes     Rx / DC Orders   ED Discharge Orders     None        Note:  This document was prepared using Dragon voice recognition software and may include unintentional dictation errors.   Please note:  Patient was evaluated in Emergency Department today for the symptoms described in the history of  present illness. Patient was evaluated in the context of the global COVID-19 pandemic, which necessitated consideration that the patient might be at risk for infection with the SARS-CoV-2 virus that causes COVID-19. Institutional protocols and algorithms that pertain to the evaluation of patients at risk for COVID-19 are in a state of rapid change based on information released by regulatory bodies including the CDC and federal and state organizations. These policies and algorithms were followed during the patient's care in the ED.  Some ED evaluations and interventions may be delayed as a result of limited staffing during the pandemic.       Don PerkingVeronese, [place], MD 08/18/21 [phone]

## 2022-03-16 ENCOUNTER — Other Ambulatory Visit: Payer: Self-pay | Admitting: Cardiology

## 2022-03-18 ENCOUNTER — Telehealth: Payer: Self-pay | Admitting: Cardiology

## 2022-03-18 NOTE — Telephone Encounter (Signed)
*  STAT* If patient is at the pharmacy, call can be transferred to refill team.   1. Which medications need to be refilled? (please list name of each medication and dose if known) irbesartan (AVAPRO) 75 MG 1 daily  2. Which pharmacy/location (including street and city if local pharmacy) is medication to be sent to? [facility] near [employer] not [facility]  3. Do they need a 30 day or 90 day supply? 90 day   Patient is out of medication - has scheduled appt

## 2022-03-18 NOTE — Telephone Encounter (Signed)
Medication was sent to correct pharmacy 03/11/2022  Disp Refills Start End   irbesartan (AVAPRO) 75 MG tablet 90 tablet 0 03/11/2021    Sig - Route: Take 1 tablet (75 mg total) by mouth daily. - Oral   Sent to pharmacy as: irbesartan (AVAPRO) 75 MG tablet   E-Prescribing Status: Receipt confirmed by pharmacy (03/11/2021 10:46 AM EST)    Pharmacy  [place], [place]

## 2022-04-01 ENCOUNTER — Encounter: Payer: Self-pay | Admitting: Cardiology

## 2022-04-01 ENCOUNTER — Ambulatory Visit: Payer: Medicaid Other | Attending: Cardiology | Admitting: Cardiology

## 2022-04-01 VITALS — BP 130/90 | HR 73 | Ht 65.0 in | Wt 205.8 lb

## 2022-04-01 DIAGNOSIS — E782 Mixed hyperlipidemia: Secondary | ICD-10-CM | POA: Diagnosis not present

## 2022-04-01 DIAGNOSIS — I10 Essential (primary) hypertension: Secondary | ICD-10-CM

## 2022-04-01 MED ORDER — IRBESARTAN 75 MG PO TABS: 75.0000 mg | ORAL_TABLET | Freq: Every day | ORAL | 3 refills | Status: DC

## 2022-04-01 NOTE — Progress Notes (Signed)
Cardiology Office Note:    Date:  04/01/2022   ID:  Victor Russo, DOB December 24, 1970, MRN [id number]  PCP:  Patient, No Pcp Per   [facility] Providers Cardiologist:  Kate Sable, MD     Referring MD: No ref. provider found   Chief Complaint  Patient presents with   Follow-up    Overdue 12 month f/u, Rx refill(out of medication), headaches    History of Present Illness:    Victor Russo is a 52 y.o. male with history of hyperlipidemia, hypertension who presents for follow-up.  Previously seen due to elevated blood pressures .   Irbesartan was started with good effect.  Ran out of BP medication 4 months ago.  Patient was last seen in the clinic in 2022.  States feeling well, does not have a primary care provider.  Denies chest pain or shortness of breath.  Prior notes Echo 11/2020 EF 55 to 60%  Past Medical History:  Diagnosis Date   Hypertension     History reviewed. No pertinent surgical history.  Current Medications: No outpatient medications have been marked as taking for the 04/01/22 encounter (Office Visit) with Kate Sable, MD.     Allergies:   Aspirin, Ibuprofen, and Tylenol [acetaminophen]   Social History   Socioeconomic History   Marital status: Married    Spouse name: Not on file   Number of children: Not on file   Years of education: Not on file   Highest education level: Not on file  Occupational History   Not on file  Tobacco Use   Smoking status: Never   Smokeless tobacco: Never  Vaping Use   Vaping Use: Never used  Substance and Sexual Activity   Alcohol use: No   Drug use: No   Sexual activity: Not on file  Other Topics Concern   Not on file  Social History Narrative   Not on file   Social Determinants of Health   Financial Resource Strain: Not on file  Food Insecurity: Not on file  Transportation Needs: Not on file  Physical Activity: Not on file  Stress: Not on file  Social Connections: Not on file     Family  History: The patient's family history includes Heart attack in his father; Hypertension in his sister.  ROS:   Please see the history of present illness.     All other systems reviewed and are negative.  EKGs/Labs/Other Studies Reviewed:    The following studies were reviewed today:   EKG:  EKG is ordered today.  EKG shows normal sinus rhythm, normal ECG.  Recent Labs: 08/18/2021: ALT 97; BUN 16; Creatinine, Ser 1.22; Hemoglobin 15.0; Platelets 322; Potassium 3.9; Sodium 141  Recent Lipid Panel    Component Value Date/Time   CHOL 168 10/30/2020 0849   TRIG 180 (H) 10/30/2020 0849   HDL 34 (L) 10/30/2020 0849   CHOLHDL 4.9 10/30/2020 0849   LDLCALC 102 (H) 10/30/2020 0849     Risk Assessment/Calculations:          Physical Exam:    VS:  BP (!) 130/90 (BP Location: Left Arm, Patient Position: Sitting, Cuff Size: Normal)   Pulse 73   Ht 5\' 5"  (1.651 m)   Wt 205 lb 12.8 oz (93.4 kg)   SpO2 96%   BMI 34.25 kg/m     Wt Readings from Last 3 Encounters:  04/01/22 205 lb 12.8 oz (93.4 kg)  08/18/21 200 lb (90.7 kg)  12/07/20 204 lb (92.5 kg)  GEN:  Well nourished, well developed in no acute distress HEENT: Normal NECK: No JVD; No carotid bruits LYMPHATICS: No lymphadenopathy CARDIAC: RRR, no murmurs, rubs, gallops RESPIRATORY:  Clear to auscultation without rales, wheezing or rhonchi  ABDOMEN: Soft, non-tender, non-distended MUSCULOSKELETAL:  No edema; No deformity  SKIN: Warm and dry NEUROLOGIC:  Alert and oriented x 3 PSYCHIATRIC:  Normal affect   ASSESSMENT:    1. Primary hypertension   2. Mixed hyperlipidemia    PLAN:    In order of problems listed above:  Hypertension, BP elevated.  Restart irbesartan 75 mg daily.  Needs to establish care with PCP. Mixed hyperlipidemia, continue low-cholesterol diet.  Not in statin benefit group.  Follow-up in 6 months or as needed if he establishes care with PCP.    Medication Adjustments/Labs and Tests  Ordered: Current medicines are reviewed at length with the patient today.  Concerns regarding medicines are outlined above.  Orders Placed This Encounter  Procedures   EKG 12-Lead     Meds ordered this encounter  Medications   DISCONTD: irbesartan (AVAPRO) 75 MG tablet    Sig: Take 1 tablet (75 mg total) by mouth daily.    Dispense:  90 tablet    Refill:  3   irbesartan (AVAPRO) 75 MG tablet    Sig: Take 1 tablet (75 mg total) by mouth daily.    Dispense:  90 tablet    Refill:  3      Patient Instructions  Medication Instructions:   Your physician recommends that you continue on your current medications as directed. Please refer to the Current Medication list given to you today.  *If you need a refill on your cardiac medications before your next appointment, please call your pharmacy*   Russo Work:  None Ordered  If you have labs (blood work) drawn today and your tests are completely normal, you will receive your results only by: MyChart Message (if you have MyChart) OR A paper copy in the mail If you have any Russo test that is abnormal or we need to change your treatment, we will call you to review the results.   Testing/Procedures:  None ordered   Follow-Up: At [facility], you and your health needs are our priority.  As part of our continuing mission to provide you with exceptional heart care, we have created designated Provider Care Teams.  These Care Teams include your primary Cardiologist (physician) and Advanced Practice Providers (APPs -  Physician Assistants and Nurse Practitioners) who all work together to provide you with the care you need, when you need it.  We recommend signing up for the patient portal called "MyChart".  Sign up information is provided on this After Visit Summary.  MyChart is used to connect with patients for Virtual Visits (Telemedicine).  Patients are able to view Russo/test results, encounter notes, upcoming appointments, etc.   Non-urgent messages can be sent to your provider as well.   To learn more about what you can do with MyChart, go to [email or web address].    Your next appointment:   6 month(s)  Provider:   You may see Debbe Odea, MD or one of the following Advanced Practice Providers on your designated Care Team:   Nicolasa Ducking, NP Eula Listen, PA-C Cadence Fransico Michael, PA-C Charlsie Quest, NP   Other Instructions  Please set up primary care physician   You may call [facility] @ [phone] for a list of primary care providers in your area Or visit their  website [email or web address] Please have any insurance card available before calling or going online.      Signed, Kate Sable, MD  04/01/2022 11:55 AM    [place]

## 2022-04-01 NOTE — Patient Instructions (Addendum)
Medication Instructions:   Your physician recommends that you continue on your current medications as directed. Please refer to the Current Medication list given to you today.  *If you need a refill on your cardiac medications before your next appointment, please call your pharmacy*   Lab Work:  None Ordered  If you have labs (blood work) drawn today and your tests are completely normal, you will receive your results only by: [place] (if you have MyChart) OR A paper copy in the mail If you have any lab test that is abnormal or we need to change your treatment, we will call you to review the results.   Testing/Procedures:  None ordered   Follow-Up: At [facility], you and your health needs are our priority.  As part of our continuing mission to provide you with exceptional heart care, we have created designated Provider Care Teams.  These Care Teams include your primary Cardiologist (physician) and Advanced Practice Providers (APPs -  Physician Assistants and Nurse Practitioners) who all work together to provide you with the care you need, when you need it.  We recommend signing up for the patient portal called "MyChart".  Sign up information is provided on this After Visit Summary.  MyChart is used to connect with patients for Virtual Visits (Telemedicine).  Patients are able to view lab/test results, encounter notes, upcoming appointments, etc.  Non-urgent messages can be sent to your provider as well.   To learn more about what you can do with MyChart, go to [email or web address].    Your next appointment:   6 month(s)  Provider:   You may see Kate Sable, MD or one of the following Advanced Practice Providers on your designated Care Team:   Murray Hodgkins, NP Christell Faith, PA-C Cadence Kathlen Mody, PA-C Gerrie Nordmann, NP   Other Instructions  Please set up primary care physician   You may call [facility] @ [phone] for a list of  primary care providers in your area Or visit their website [email or web address] Please have any insurance card available before calling or going online.

## 2023-06-19 IMAGING — CT CT RENAL STONE PROTOCOL
2 of 4 series · 15 of 46 positions shown, 17 images · non-contrast
Comparison: CT without contrast 03/21/2020

CLINICAL DATA: Flank pain, kidney stone suspected.



[Series 2: stone full standard · axial · 0.84mm/px · z∈[-938,-478]mm · 12 of 109 slices shown, 14 images]
[im 9/109  soft-tissue]
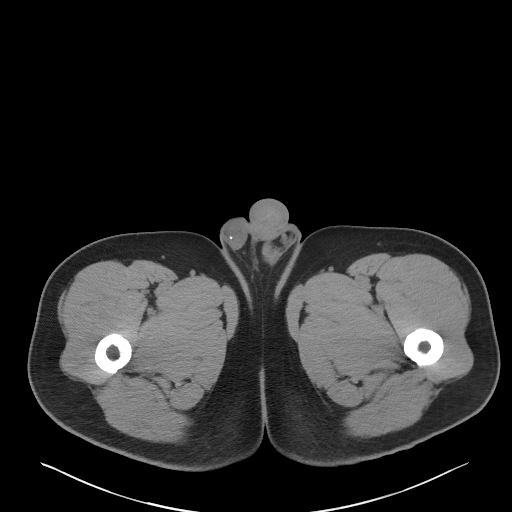
[im 9/109  bone]
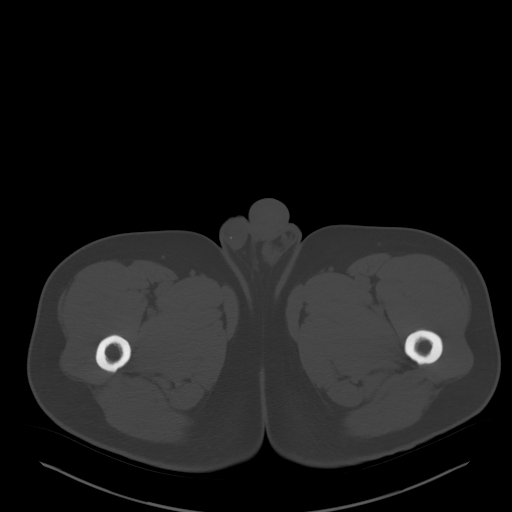
[im 17/109  soft-tissue]
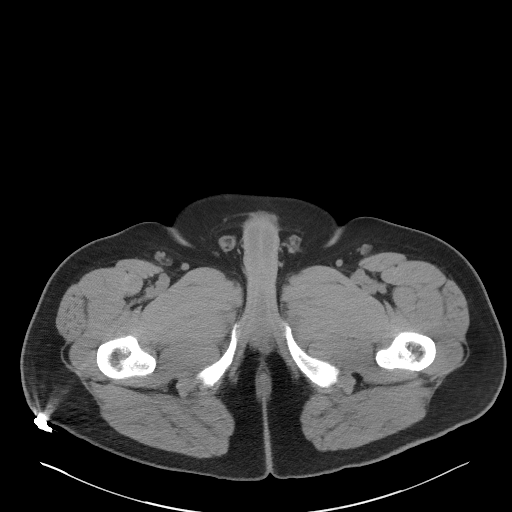
[im 25/109  soft-tissue]
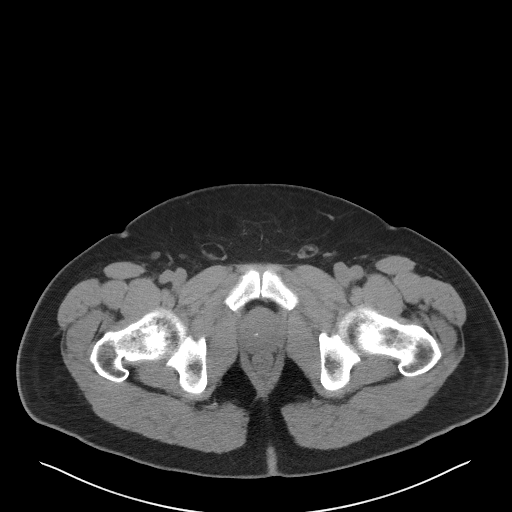
[im 33/109  soft-tissue]
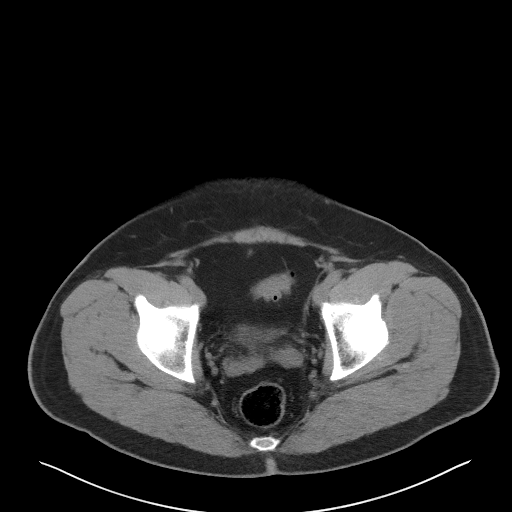
[im 41/109  soft-tissue]
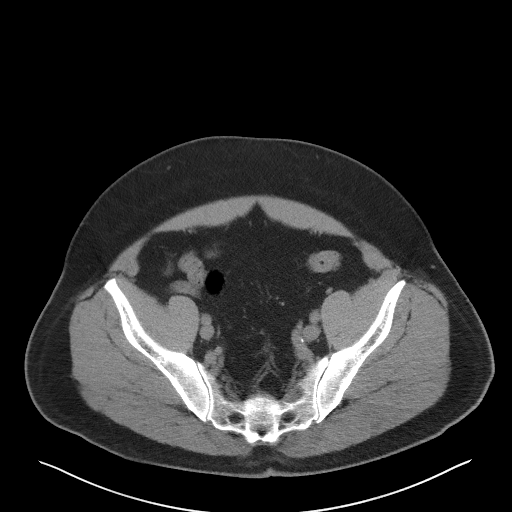
[im 49/109  soft-tissue]
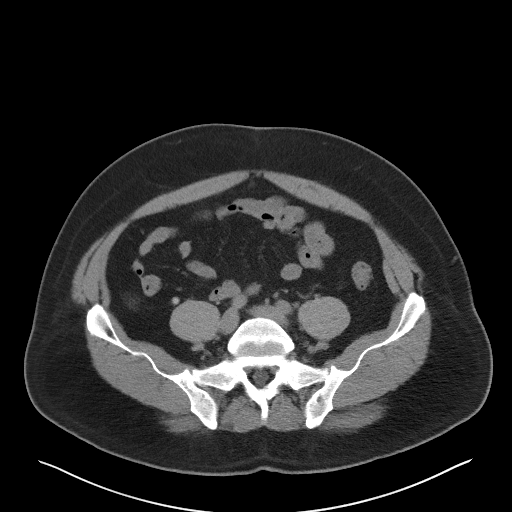
[im 61/109  soft-tissue]
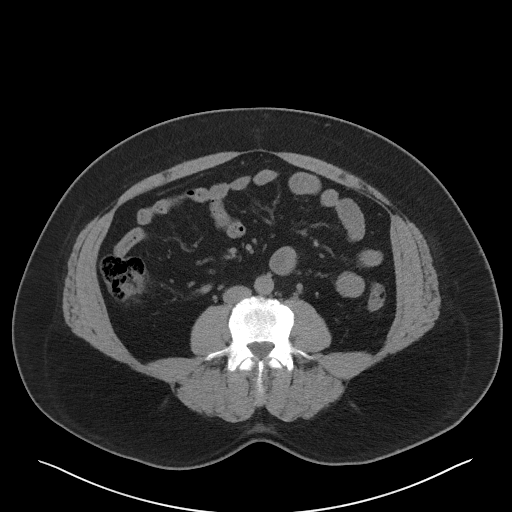
[im 69/109  soft-tissue]
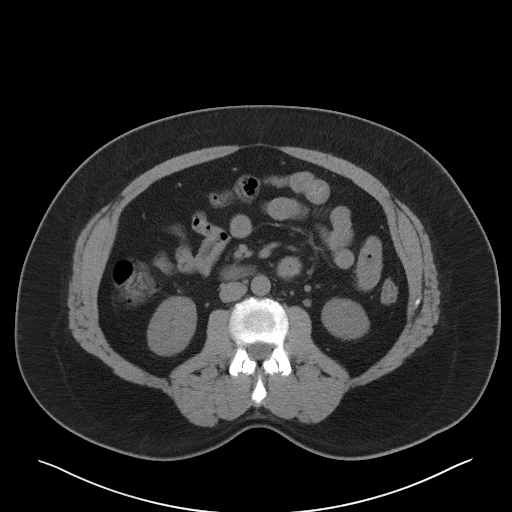
[im 77/109  soft-tissue]
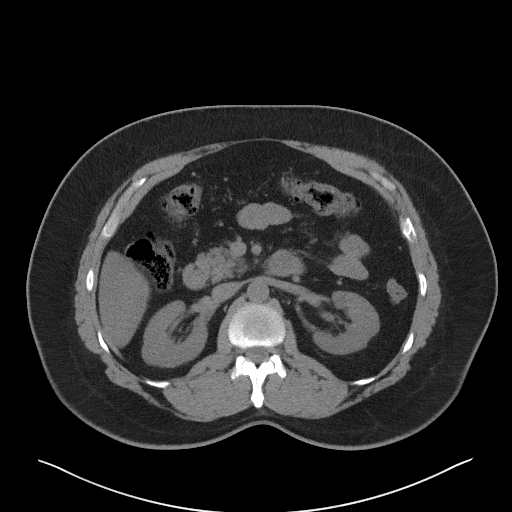
[im 77/109  bone]
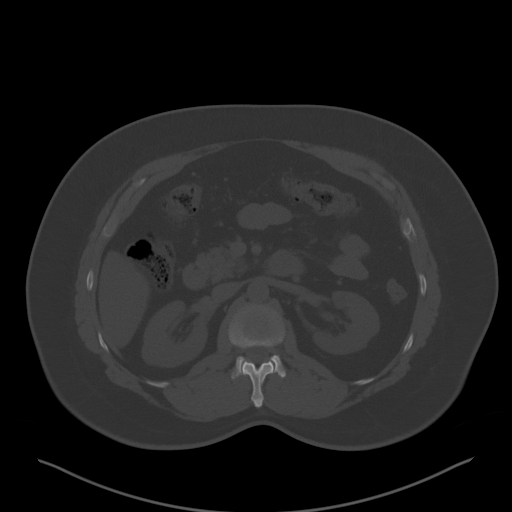
[im 85/109  soft-tissue]
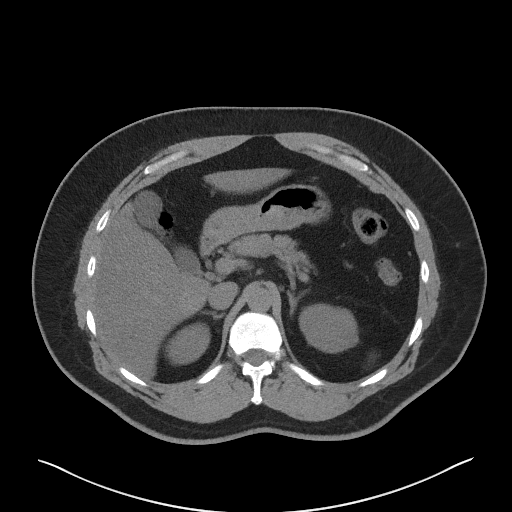
[im 93/109  soft-tissue]
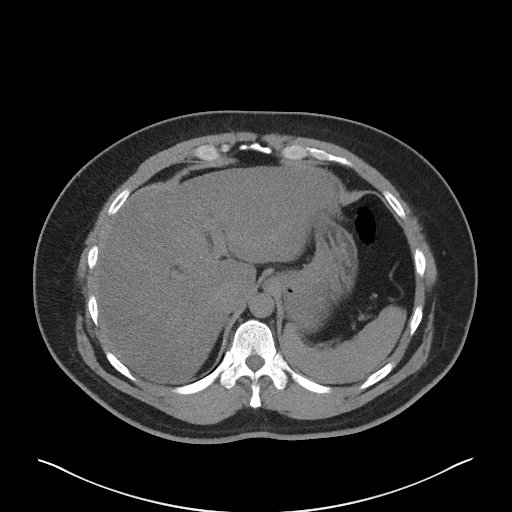
[im 101/109  soft-tissue]
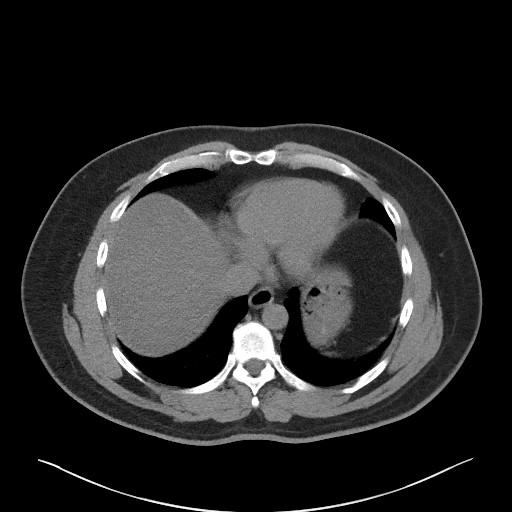

[Series 5: coronal · coronal · 0.81mm/px · 3 of 159 slices shown]
[im 53/159  soft-tissue]
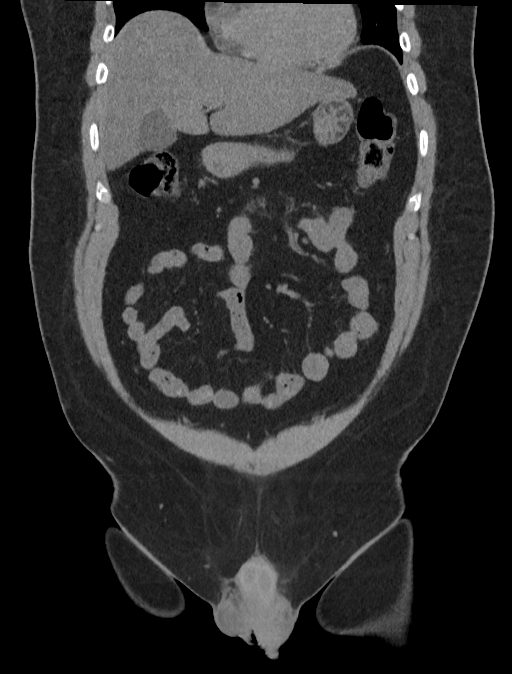
[im 71/159  soft-tissue]
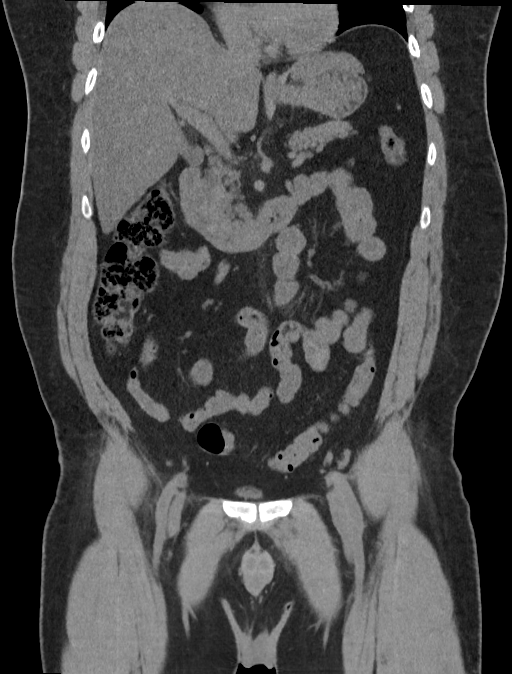
[im 88/159  soft-tissue]
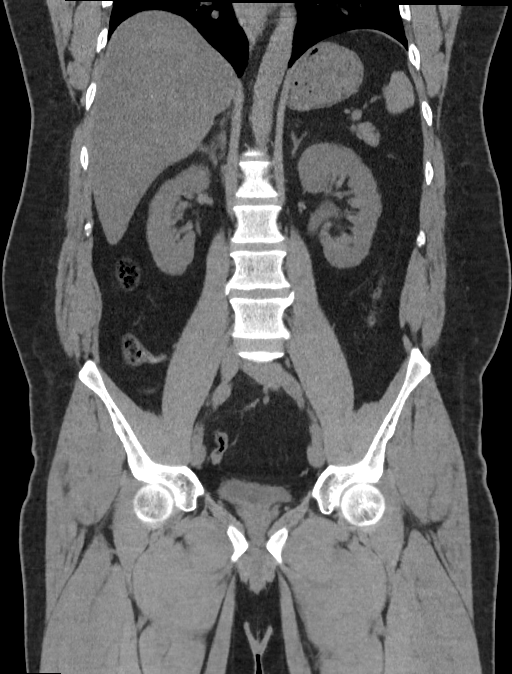

[15 of 46 positions shown; findings below may reference images not displayed]

FINDINGS: Lower chest: No acute abnormality.

Hepatobiliary: The liver is 20 cm length with moderate to severe
steatosis. Gallbladder and bile ducts are unremarkable. No liver
masses seen without contrast.

Pancreas: No focal abnormality.

Spleen: No focal abnormality or splenomegaly.

Adrenals/Urinary Tract: The unenhanced adrenal glands and renal
cortex are unremarkable. There are scattered punctate nonobstructive
caliceal stones in the right kidney.

On the left there is no intrarenal stone. There is mild left
hydroureteronephrosis to the bladder base. No left ureteral stone is
seen.

There is a 2 mm stone in the bladder right of the midline which
could be simply within the bladder lumen having passed from the
left, or possibly lodged in the intravesical orifice of the right
UVJ.

On the right there is no hydroureteronephrosis. The bladder wall is
unremarkable for the degree of distention.

Stomach/Bowel: No dilatation or wall thickening including of the
appendix. There are scattered left colonic diverticula without
evidence of diverticulitis.

Vascular/Lymphatic: No significant vascular findings are present. No
enlarged abdominal or pelvic lymph nodes.

Reproductive: Enlarged prostate, measures 4.8 cm transverse.

Other: There is no free air, hemorrhage or fluid. There are small
umbilical and inguinal fat hernias. There is no incarcerated hernia.

Musculoskeletal: No acute or significant osseous findings.
IMPRESSION: 1. Mild left hydroureteronephrosis.  No left ureteral stone.
2. 2 mm calcification in the bladder which could be free in the
posterior lumen to the right or could less likely be lodged in the
intravesical orifice of the right UVJ. There is no right
hydronephrosis.
3. Nonobstructive right-sided micronephrolithiasis. No intrarenal
stone on the left.
4. Moderate to severe hepatic steatosis. Correlate clinically for
steatohepatitis.
5. Diverticulosis without diverticulitis.
6. Umbilical and inguinal fat hernias.
7. Prostatomegaly.

## 2023-08-03 ENCOUNTER — Encounter: Payer: Self-pay | Admitting: Cardiology

## 2023-12-19 ENCOUNTER — Other Ambulatory Visit: Payer: Self-pay | Admitting: Cardiology

## 2024-01-10 ENCOUNTER — Telehealth: Payer: Self-pay | Admitting: Cardiology

## 2024-01-10 DIAGNOSIS — Z79899 Other long term (current) drug therapy: Secondary | ICD-10-CM

## 2024-01-10 NOTE — Telephone Encounter (Signed)
 Pt is requesting a callback regarding him wanting to know if he can have lab orders put in before his appt. Please advise

## 2024-01-11 NOTE — Telephone Encounter (Signed)
 Pt made aware via interpreter # [id number]  Darliss Rogue, MD to Me  Brien Salm, RN (Selected Message)     01/11/24  1:13 PM Obtain fasting lipid profile 1 to 2 days prior to follow-up visit.  Ty

## 2024-01-25 ENCOUNTER — Other Ambulatory Visit: Payer: Self-pay | Admitting: Cardiology

## 2024-01-29 ENCOUNTER — Encounter: Payer: Self-pay | Admitting: Medical

## 2024-01-29 ENCOUNTER — Ambulatory Visit: Admitting: Medical

## 2024-01-29 ENCOUNTER — Ambulatory Visit: Attending: Medical | Admitting: Medical

## 2024-01-29 VITALS — BP 132/78 | HR 73 | Ht 65.0 in | Wt 191.2 lb

## 2024-01-29 DIAGNOSIS — Z79899 Other long term (current) drug therapy: Secondary | ICD-10-CM | POA: Insufficient documentation

## 2024-01-29 DIAGNOSIS — I10 Essential (primary) hypertension: Secondary | ICD-10-CM | POA: Insufficient documentation

## 2024-01-29 MED ORDER — IRBESARTAN 75 MG PO TABS: 75.0000 mg | ORAL_TABLET | Freq: Every day | ORAL | 3 refills | Status: AC

## 2024-01-29 NOTE — Patient Instructions (Signed)
 Medication Instructions:   *IfYour physician recommends that you continue on your current medications as directed. Please refer to the Current Medication list given to you today.   you need a refill on your cardiac medications before your next appointment, please call your pharmacy*  Lab Work: Your provider would like for you to have following labs drawn today CMET, CBC Lipid, TSH and ALC.   If you have labs (blood work) drawn today and your tests are completely normal, you will receive your results only by: MyChart Message (if you have MyChart) OR A paper copy in the mail If you have any lab test that is abnormal or we need to change your treatment, we will call you to review the results.  Testing/Procedures: No test ordered today   Follow-Up: At [facility], you and your health needs are our priority.  As part of our continuing mission to provide you with exceptional heart care, our providers are all part of one team.  This team includes your primary Cardiologist (physician) and Advanced Practice Providers or APPs (Physician Assistants and Nurse Practitioners) who all work together to provide you with the care you need, when you need it.  Your next appointment:   1 year(s)  Provider:   You may see Redell Cave, MD or one of the following Advanced Practice Providers on your designated Care Team:   Lonni Meager, NP Lesley Maffucci, PA-C Bernardino Bring, PA-C Cadence [place], PA-C Tylene Lunch, NP Barnie Hila, NP    We recommend signing up for the patient portal called MyChart.  Sign up information is provided on this After Visit Summary.  MyChart is used to connect with patients for Virtual Visits (Telemedicine).  Patients are able to view lab/test results, encounter notes, upcoming appointments, etc.  Non-urgent messages can be sent to your provider as well.   To learn more about what you can do with MyChart, go to [email or web address].

## 2024-01-29 NOTE — Progress Notes (Signed)
  Cardiology Office Note   Date:  01/29/2024  ID:  Victor Russo, DOB 26-Jun-1970, MRN [id number] PCP: Patient, No Pcp Per  [facility] Providers Cardiologist:  Redell Cave, MD   History of Present Illness Victor Russo is a 53 y.o. male with a history of hyperlipidemia and hypertension who presents for 1 year follow-up.  Echocardiogram in 2022 showed EF of 55 to 66%.  Patient was last seen 03/2022 and had run out of blood pressure medication 4 months prior.  He was restarted on his blood pressure meds.  Today, the patient is overall doing well. He takes one BP medication and takes it most days. He denies chest pain, SOB, lightheadedness, palpitations. 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 Due to driving eating healthy and exercising are difficult.   Studies Reviewed EKG Interpretation Date/Time:  Monday January 29 2024 07:59:24 EST Ventricular Rate:  73 PR Interval:  154 QRS Duration:  82 QT Interval:  376 QTC Calculation: 414 R Axis:   31  Text Interpretation: Normal sinus rhythm Normal ECG When compared with ECG of 17-Feb-2017 13:08, Premature ventricular complexes are no longer Present Vent. rate has decreased BY  38 BPM Confirmed by Franchester, Aija Scarfo ([id number]) on 01/29/2024 8:06:04 AM    Echo 11/2020 1. Left ventricular ejection fraction, by estimation, is 55 to 60%. The  left ventricle has normal function. The left ventricle has no regional  wall motion abnormalities. Left ventricular diastolic parameters were  normal.   2. Right ventricular systolic function is normal. The right ventricular  size is normal. Tricuspid regurgitation signal is inadequate for assessing  PA pressure.   3. The mitral valve is normal in structure. No evidence of mitral valve  regurgitation. No evidence of mitral stenosis.   4. The aortic valve is normal in structure. Aortic valve regurgitation is  not visualized. No aortic stenosis is present.   5. The  inferior vena cava is normal in size with greater than 50%  respiratory variability, suggesting right atrial pressure of 3 mmHg.       Physical Exam VS:  BP 132/78 (BP Location: Left Arm, Patient Position: Sitting, Cuff Size: Normal)   Pulse 73   Ht 5' 5 (1.651 m)   Wt 191 lb 3.2 oz (86.7 kg)   SpO2 97%   BMI 31.82 kg/m        Wt Readings from Last 3 Encounters:  01/29/24 191 lb 3.2 oz (86.7 kg)  04/01/22 205 lb 12.8 oz (93.4 kg)  08/18/21 200 lb (90.7 kg)    GEN: Well nourished, well developed in no acute distress NECK: No JVD; No carotid bruits CARDIAC: RRR, no murmurs, rubs, gallops RESPIRATORY:  Clear to auscultation without rales, wheezing or rhonchi  ABDOMEN: Soft, non-tender, non-distended EXTREMITIES:  No edema; No deformity   ASSESSMENT AND PLAN  HTN BP today is reasonable. The patient occasionally misses doses. Continue Irbesartan  75mg  daily. I will send in a refill today.  I will update labs today, c-Met, lipid panel, CBC, magnesium, TSH.  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  Lifestyle changes discussed today.  HLD Check lipid panel as above.  Discussed lifestyle changes as above.       Dispo: Follow-up in 1 year  Signed, Eryca Bolte VEAR Franchester, PA-C

## 2024-01-30 LAB — CBC
Hematocrit: 46.8 % (ref 37.5–51.0)
Hemoglobin: 15.2 g/dL (ref 13.0–17.7)
MCH: 29.3 pg (ref 26.6–33.0)
MCHC: 32.5 g/dL (ref 31.5–35.7)
MCV: 90 fL (ref 79–97)
Platelets: 325 x10E3/uL (ref 150–450)
RBC: 5.19 x10E6/uL (ref 4.14–5.80)
RDW: 12.2 % (ref 11.6–15.4)
WBC: 7 x10E3/uL (ref 3.4–10.8)

## 2024-01-30 LAB — HEMOGLOBIN A1C
Est. average glucose Bld gHb Est-mCnc: 312 mg/dL
Hgb A1c MFr Bld: 12.5 % — ABNORMAL HIGH (ref 4.8–5.6)

## 2024-01-30 LAB — COMPREHENSIVE METABOLIC PANEL WITH GFR
ALT: 115 IU/L — ABNORMAL HIGH (ref 0–44)
AST: 65 IU/L — ABNORMAL HIGH (ref 0–40)
Albumin: 4 g/dL (ref 3.8–4.9)
Alkaline Phosphatase: 90 IU/L (ref 47–123)
BUN/Creatinine Ratio: 14 (ref 9–20)
BUN: 15 mg/dL (ref 6–24)
Bilirubin Total: 0.3 mg/dL (ref 0.0–1.2)
CO2: 24 mmol/L (ref 20–29)
Calcium: 9.5 mg/dL (ref 8.7–10.2)
Chloride: 96 mmol/L (ref 96–106)
Creatinine, Ser: 1.04 mg/dL (ref 0.76–1.27)
Globulin, Total: 3 g/dL (ref 1.5–4.5)
Glucose: 274 mg/dL — ABNORMAL HIGH (ref 70–99)
Potassium: 4.2 mmol/L (ref 3.5–5.2)
Sodium: 137 mmol/L (ref 134–144)
Total Protein: 7 g/dL (ref 6.0–8.5)
eGFR: 87 mL/min/1.73 (ref >59)

## 2024-01-30 LAB — LIPID PANEL
Chol/HDL Ratio: 5.2 ratio — ABNORMAL HIGH (ref 0.0–5.0)
Cholesterol, Total: 171 mg/dL (ref 100–199)
HDL: 33 mg/dL — ABNORMAL LOW (ref >39)
LDL Chol Calc (NIH): 100 mg/dL — ABNORMAL HIGH (ref 0–99)
Triglycerides: 222 mg/dL — ABNORMAL HIGH (ref 0–149)
VLDL Cholesterol Cal: 38 mg/dL (ref 5–40)

## 2024-01-30 LAB — TSH: TSH: 2.14 u[IU]/mL (ref 0.450–4.500)

## 2024-02-06 ENCOUNTER — Encounter: Payer: Self-pay | Admitting: Student

## 2024-02-06 ENCOUNTER — Telehealth: Payer: Self-pay

## 2024-02-06 ENCOUNTER — Ambulatory Visit: Admitting: Student

## 2024-02-06 ENCOUNTER — Ambulatory Visit: Payer: Self-pay | Admitting: Medical

## 2024-02-06 ENCOUNTER — Other Ambulatory Visit (HOSPITAL_COMMUNITY): Payer: Self-pay

## 2024-02-06 VITALS — BP 116/80 | HR 87 | Ht 65.0 in | Wt 191.0 lb

## 2024-02-06 DIAGNOSIS — E782 Mixed hyperlipidemia: Secondary | ICD-10-CM | POA: Diagnosis not present

## 2024-02-06 DIAGNOSIS — E119 Type 2 diabetes mellitus without complications: Secondary | ICD-10-CM | POA: Insufficient documentation

## 2024-02-06 DIAGNOSIS — E669 Obesity, unspecified: Secondary | ICD-10-CM | POA: Insufficient documentation

## 2024-02-06 DIAGNOSIS — E1165 Type 2 diabetes mellitus with hyperglycemia: Secondary | ICD-10-CM

## 2024-02-06 DIAGNOSIS — E785 Hyperlipidemia, unspecified: Secondary | ICD-10-CM | POA: Insufficient documentation

## 2024-02-06 DIAGNOSIS — I10 Essential (primary) hypertension: Secondary | ICD-10-CM

## 2024-02-06 MED ORDER — OZEMPIC (0.25 OR 0.5 MG/DOSE) 2 MG/3ML ~~LOC~~ SOPN: 0.2500 mg | PEN_INJECTOR | SUBCUTANEOUS | 1 refills | Status: DC

## 2024-02-06 MED ORDER — METFORMIN HCL ER 500 MG PO TB24: 500.0000 mg | ORAL_TABLET | Freq: Every day | ORAL | 1 refills | Status: DC

## 2024-02-06 NOTE — Assessment & Plan Note (Signed)
 Not currently on medication. The 10-year ASCVD risk score (Arnett DK, et al., 2019) is: 8.8%. Reports he eat red meat daily. Discussed dietary changes and regular exercise. Will discuss statin medication at next visit.

## 2024-02-06 NOTE — Telephone Encounter (Signed)
 [email or web address] key: [id number]

## 2024-02-06 NOTE — Telephone Encounter (Signed)
 Pharmacy Patient Advocate Encounter   Received notification from Physician's Office that prior authorization for Ozempic (0.25 or 0.5 MG/DOSE) 2MG /3ML pen-injectors is required/requested.   Insurance verification completed.   The patient is insured through [employer] .   Per test claim: PA required; PA submitted to above mentioned insurance via Latent Key/confirmation #/EOC [id number] Status is pending

## 2024-02-06 NOTE — Patient Instructions (Signed)
 Start metformin 500 mg once a day for 1 week before dinner. Increase to 1000 mg with dinner one a day after 1 week.  Start Ozempic 0.25 mg weekly   Check you blood sugar once a day before eating or drinking Keep a log of the number and bring it to your next appointment

## 2024-02-06 NOTE — Progress Notes (Signed)
 New Patient Office Visit  Subjective    Patient ID: Victor Russo, male    DOB: 10-11-1970  Age: 53 y.o. MRN: [id number]  CC:  Chief Complaint  Patient presents with   Establish Care   Hypertension   Diabetes    HPI Victor Russo is a 53 year old person living with HTN, HLD, and T2DM who presents to establish care. 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 Has been working on dietary changes. Has been check home glucoses most recent fast glucose has been around 260.    Diet consists of Morning:coke Noon:coke and donuts Dinner: meat, rice, fries, salad  Outpatient Encounter Medications as of 02/06/2024  Medication Sig   irbesartan  (AVAPRO ) 75 MG tablet Take 1 tablet (75 mg total) by mouth daily.   metFORMIN (GLUCOPHAGE-XR) 500 MG 24 hr tablet Take 1 tablet (500 mg total) by mouth daily with breakfast.   Semaglutide,0.25 or 0.5MG /DOS, (OZEMPIC, 0.25 OR 0.5 MG/DOSE,) 2 MG/3ML SOPN Inject 0.25 mg into the skin once a week.   No facility-administered encounter medications on file as of 02/06/2024.    Past Medical History:  Diagnosis Date   Hypertension     History reviewed. No pertinent surgical history.  Family History  Problem Relation Age of Onset   Cancer Mother 19       gastric   Heart attack Father    Hypertension Sister     Social History   Socioeconomic History   Marital status: Married    Spouse name: Not on file   Number of children: Not on file   Years of education: Not on file   Highest education level: Not on file  Occupational History   Not on file  Tobacco Use   Smoking status: Never   Smokeless tobacco: Never  Vaping Use   Vaping status: Never Used  Substance and Sexual Activity   Alcohol use: No   Drug use: No   Sexual activity: Not on file  Other Topics Concern   Not on file  Social History Narrative   Not on file   Social Drivers of Health   Financial Resource Strain: High Risk (02/05/2024)   Overall Financial Resource  Strain (CARDIA)    Difficulty of Paying Living Expenses: Hard  Food Insecurity: Food Insecurity Present (02/05/2024)   Hunger Vital Sign    Worried About Running Out of Food in the Last Year: Sometimes true    Ran Out of Food in the Last Year: Sometimes true  Transportation Needs: Unmet Transportation Needs (02/05/2024)   PRAPARE - Administrator, Civil Service (Medical): Yes    Lack of Transportation (Non-Medical): No  Physical Activity: Unknown (02/05/2024)   Exercise Vital Sign    Days of Exercise per Week: Patient declined    Minutes of Exercise per Session: Not on file  Stress: Stress Concern Present (02/05/2024)   [employer] of Occupational Health - Occupational Stress Questionnaire    Feeling of Stress: Rather much  Social Connections: Unknown (02/05/2024)   Social Connection and Isolation Panel    Frequency of Communication with Friends and Family: Once a week    Frequency of Social Gatherings with Friends and Family: Patient declined    Attends Religious Services: Not on Insurance Claims Handler of Clubs or Organizations: No    Attends Banker Meetings: Not on file    Marital Status: Married  Intimate Partner Violence: Not At Risk (02/06/2024)  Humiliation, Afraid, Rape, and Kick questionnaire    Fear of Current or Ex-Partner: No    Emotionally Abused: No    Physically Abused: No    Sexually Abused: No    ROS Refer to HPI    Objective   BP 116/80   Pulse 87   Ht 5' 5 (1.651 m)   Wt 191 lb (86.6 kg)   SpO2 97%   BMI 31.78 kg/m   Physical Exam Constitutional:      Appearance: Normal appearance.  HENT:     Mouth/Throat:     Mouth: Mucous membranes are moist.     Pharynx: Oropharynx is clear.  Cardiovascular:     Rate and Rhythm: Normal rate and regular rhythm.     Pulses: Normal pulses.     Heart sounds: No murmur heard.    Comments: Normal DP and PT pulses of both feet Pulmonary:     Effort: Pulmonary effort is normal.      Breath sounds: No rhonchi or rales.  Abdominal:     General: Abdomen is flat. Bowel sounds are normal. There is no distension.     Palpations: Abdomen is soft.     Tenderness: There is no abdominal tenderness.  Musculoskeletal:        General: Normal range of motion.     Right lower leg: No edema.     Left lower leg: No edema.  Skin:    General: Skin is warm and dry.     Capillary Refill: Capillary refill takes less than 2 seconds.     Comments: No skin changes of the feet  Neurological:     General: No focal deficit present.     Mental Status: He is alert and oriented to person, place, and time.  Psychiatric:        Mood and Affect: Mood normal.        Behavior: Behavior normal.    Diabetic Foot Exam - Simple   Simple Foot Form Diabetic Foot exam was performed with the following findings: Yes 02/06/2024 11:30 AM  Visual Inspection No deformities, no ulcerations, no other skin breakdown bilaterally: Yes Sensation Testing Intact to touch and monofilament testing bilaterally: Yes Pulse Check Posterior Tibialis and Dorsalis pulse intact bilaterally: Yes Comments        02/06/2024   12:06 PM  Depression screen PHQ 2/9  Decreased Interest 0  Down, Depressed, Hopeless 0  PHQ - 2 Score 0  Altered sleeping 0  Tired, decreased energy 0  Change in appetite 0  Feeling bad or failure about yourself  0  Trouble concentrating 0  Moving slowly or fidgety/restless 0  Suicidal thoughts 0  PHQ-9 Score 0  Difficult doing work/chores Not difficult at all      02/06/2024   12:06 PM  GAD 7 : Generalized Anxiety Score  Nervous, Anxious, on Edge 0  Control/stop worrying 0  Worry too much - different things 0  Trouble relaxing 0  Restless 0  Easily annoyed or irritable 0  Afraid - awful might happen 0  Total GAD 7 Score 0  Anxiety Difficulty Not difficult at all      Latest Ref Rng & Units 01/29/2024    8:28 AM 08/18/2021    3:24 AM 03/20/2020    9:06 PM  CBC  WBC 3.4 - 10.8  x10E3/uL 7.0  10.2  7.3   Hemoglobin 13.0 - 17.7 g/dL 84.7  84.9  85.5   Hematocrit 37.5 - 51.0 % 46.8  46.1  43.2   Platelets 150 - 450 x10E3/uL 325  322  314       Latest Ref Rng & Units 01/29/2024    8:28 AM 08/18/2021    3:24 AM 03/20/2020    9:06 PM  CMP  Glucose 70 - 99 mg/dL 725  872  889   BUN 6 - 24 mg/dL 15  16  16    Creatinine 0.76 - 1.27 mg/dL 8.95  8.77  8.41   Sodium 134 - 144 mmol/L 137  141  141   Potassium 3.5 - 5.2 mmol/L 4.2  3.9  4.0   Chloride 96 - 106 mmol/L 96  107  102   CO2 20 - 29 mmol/L 24  25  26    Calcium 8.7 - 10.2 mg/dL 9.5  8.8  9.1   Total Protein 6.0 - 8.5 g/dL 7.0  7.6    Total Bilirubin 0.0 - 1.2 mg/dL 0.3  0.8    Alkaline Phos 47 - 123 IU/L 90  82    AST 0 - 40 IU/L 65  58    ALT 0 - 44 IU/L 115  97     Lipid Panel     Component Value Date/Time   CHOL 171 01/29/2024 0828   TRIG 222 (H) 01/29/2024 0828   HDL 33 (L) 01/29/2024 0828   CHOLHDL 5.2 (H) 01/29/2024 0828   LDLCALC 100 (H) 01/29/2024 0828   LABVLDL 38 01/29/2024 0828    Lab Results  Component Value Date   HGBA1C 12.5 (H) 01/29/2024         Assessment & Plan:  Type 2 diabetes mellitus with hyperglycemia, without long-term current use of insulin (HCC) Assessment & Plan: Lab Results  Component Value Date   HGBA1C 12.5 (H) 01/29/2024   Reports worsening vision blurred and occasional polydipsia and polyuria that has improved with dietary changes in the las t week. Is no long drinking sugary beverages and eating more salad. Reports blood sugar has between 230-230. Reports daughter had good weight loss with GLP1a and is interested in trying this. -start metformin 500 mg twice daily -start ozempic 0.25 mg weekly -Foot exam performed today, no abnormalities -He will make eye appointment for diabetic eye exam.  -Referral to RD -Follow up in 1 month  Orders: -     Microalbumin / creatinine urine ratio -     Referral to Nutrition and Diabetes Services  Primary  hypertension Assessment & Plan: Current on irbesartan  75 mg daily. He denies HA, cp, dyspnea, dizziness, lightheadness. Normotensive today. Reports he occasionally misses a dose, encouraged him to take medication regularly.    Mixed hyperlipidemia Assessment & Plan: Not currently on medication. The 10-year ASCVD risk score (Arnett DK, et al., 2019) is: 8.8%. Reports he eat red meat daily. Discussed dietary changes and regular exercise. Will discuss statin medication at next visit.    Other orders -     metFORMIN HCl ER; Take 1 tablet (500 mg total) by mouth daily with breakfast.  Dispense: 60 tablet; Refill: 1 -     Ozempic (0.25 or 0.5 MG/DOSE); Inject 0.25 mg into the skin once a week.  Dispense: 3 mL; Refill: 1    Return in about 4 weeks (around 03/05/2024) for DM.   Harlene Saddler, MD

## 2024-02-06 NOTE — Assessment & Plan Note (Addendum)
 Lab Results  Component Value Date   HGBA1C 12.5 (H) 01/29/2024   Reports worsening vision blurred and occasional polydipsia and polyuria that has improved with dietary changes in the las t week. Is no long drinking sugary beverages and eating more salad. Reports blood sugar has between 230-230. Reports daughter had good weight loss with GLP1a and is interested in trying this. -start metformin 500 mg twice daily -start ozempic 0.25 mg weekly -Foot exam performed today, no abnormalities -He will make eye appointment for diabetic eye exam.  -Referral to RD -Follow up in 1 month

## 2024-02-06 NOTE — Assessment & Plan Note (Addendum)
 Current on irbesartan  75 mg daily. He denies HA, cp, dyspnea, dizziness, lightheadness. Normotensive today. Reports he occasionally misses a dose, encouraged him to take medication regularly.

## 2024-02-06 NOTE — Telephone Encounter (Signed)
 PA request has been Started. New Encounter has been or will be created for follow up. For additional info see Pharmacy Prior Auth telephone encounter from 02/06/2024.

## 2024-02-07 LAB — MICROALBUMIN / CREATININE URINE RATIO
Creatinine, Urine: 94.5 mg/dL
Microalb/Creat Ratio: 533 mg/g{creat} — ABNORMAL HIGH (ref 0–29)
Microalbumin, Urine: 503.3 ug/mL

## 2024-02-07 NOTE — Telephone Encounter (Signed)
 Semaglutide,0.25 or 0.5MG /DOS, (OZEMPIC, 0.25 OR 0.5 MG/DOSE,) 2 MG/3ML SOPN  Sig: Inject 0.25 mg into the skin once a week.

## 2024-02-07 NOTE — Telephone Encounter (Signed)
 Which medication? We can run a test claim and try for a prior auth if needed.

## 2024-02-07 NOTE — Telephone Encounter (Signed)
 Pharmacy Patient Advocate Encounter  Received notification from [facility] that Prior Authorization for Ozempic (0.25 or 0.5 MG/DOSE) 2MG /3ML pen-injectors has been DENIED.  Full denial letter will be uploaded to the media tab. See denial reason below.   PA #/Case ID/Reference #: [id number]

## 2024-02-08 ENCOUNTER — Telehealth: Payer: Self-pay

## 2024-02-08 ENCOUNTER — Encounter: Payer: Self-pay | Admitting: Student

## 2024-02-08 ENCOUNTER — Other Ambulatory Visit (HOSPITAL_COMMUNITY): Payer: Self-pay

## 2024-02-08 ENCOUNTER — Ambulatory Visit: Payer: Self-pay | Admitting: Student

## 2024-02-08 MED ORDER — METFORMIN HCL ER 500 MG PO TB24: 500.0000 mg | ORAL_TABLET | Freq: Two times a day (BID) | ORAL | 1 refills | Status: DC

## 2024-02-08 NOTE — Telephone Encounter (Signed)
 PA request has been Started. New Encounter has been or will be created for follow up. For additional info see Pharmacy Prior Auth telephone encounter from 02/08/24.

## 2024-02-08 NOTE — Addendum Note (Signed)
 Addended by: LEMON RAISIN on: 02/08/2024 09:39 AM   Modules accepted: Orders

## 2024-02-08 NOTE — Telephone Encounter (Signed)
 Pharmacy Patient Advocate Encounter   Received notification from Pt Calls Messages that prior authorization for Jardiance 10MG  tablets  is required/requested.   Insurance verification completed.   The patient is insured through [employer] Medicaid .   Per test claim: PA required; PA started via CoverMyMeds. KEY [id number] . Please see clinical question(s) below that I am not finding the answer to in their chart and advise.

## 2024-02-08 NOTE — Telephone Encounter (Signed)
 Please review

## 2024-02-08 NOTE — Telephone Encounter (Signed)
 Patient has been notified. Please see other telephone encounter.

## 2024-02-12 NOTE — Telephone Encounter (Signed)
 Please review patient's message:

## 2024-03-11 ENCOUNTER — Encounter: Payer: Self-pay | Admitting: Student

## 2024-03-11 ENCOUNTER — Other Ambulatory Visit: Payer: Self-pay | Admitting: Student

## 2024-03-11 ENCOUNTER — Ambulatory Visit: Admitting: Student

## 2024-03-11 VITALS — BP 118/72 | HR 86 | Ht 65.0 in | Wt 182.0 lb

## 2024-03-11 DIAGNOSIS — Z7984 Long term (current) use of oral hypoglycemic drugs: Secondary | ICD-10-CM | POA: Diagnosis not present

## 2024-03-11 DIAGNOSIS — E1165 Type 2 diabetes mellitus with hyperglycemia: Secondary | ICD-10-CM

## 2024-03-11 MED ORDER — METFORMIN HCL ER 500 MG PO TB24: 500.0000 mg | ORAL_TABLET | Freq: Two times a day (BID) | ORAL | 1 refills | Status: DC

## 2024-03-11 MED ORDER — METFORMIN HCL ER 500 MG PO TB24: 500.0000 mg | ORAL_TABLET | Freq: Two times a day (BID) | ORAL | 1 refills | Status: AC

## 2024-03-11 NOTE — Progress Notes (Signed)
 "  Established Patient Office Visit  Subjective   Patient ID: Victor Russo, male    DOB: 05-13-1970  Age: 54 y.o. MRN: [id number]  Chief Complaint  Patient presents with   Diabetes   Hypertension    Victor Russo is a 54 y.o. person with medical hx listed below who presents today for diabetes follow up. Reports home glucoses are improved. Has reduced sugar intake, no long drinking soda. Having coffee instead.   Patient Active Problem List   Diagnosis Date Noted   Hypertension 02/06/2024   T2DM (type 2 diabetes mellitus) (HCC) 02/06/2024   HLD (hyperlipidemia) 02/06/2024   Obesity (BMI 30-39.9) 02/06/2024      ROS Refer to HPI    Objective:     Outpatient Encounter Medications as of 03/11/2024  Medication Sig   irbesartan  (AVAPRO ) 75 MG tablet Take 1 tablet (75 mg total) by mouth daily.   [DISCONTINUED] metFORMIN  (GLUCOPHAGE -XR) 500 MG 24 hr tablet Take 1 tablet (500 mg total) by mouth 2 (two) times daily with a meal.   metFORMIN  (GLUCOPHAGE -XR) 500 MG 24 hr tablet Take 1 tablet (500 mg total) by mouth 2 (two) times daily with a meal.   [DISCONTINUED] metFORMIN  (GLUCOPHAGE -XR) 500 MG 24 hr tablet Take 1 tablet (500 mg total) by mouth 2 (two) times daily with a meal.   [DISCONTINUED] Semaglutide ,0.25 or 0.5MG /DOS, (OZEMPIC , 0.25 OR 0.5 MG/DOSE,) 2 MG/3ML SOPN Inject 0.25 mg into the skin once a week. (Patient not taking: Reported on 03/11/2024)   No facility-administered encounter medications on file as of 03/11/2024.    BP 118/72   Pulse 86   Ht 5' 5 (1.651 m)   Wt 182 lb (82.6 kg)   SpO2 98%   BMI 30.29 kg/m  BP Readings from Last 3 Encounters:  03/11/24 118/72  02/06/24 116/80  01/29/24 132/78    Physical Exam Constitutional:      Appearance: Normal appearance.  HENT:     Head: Normocephalic and atraumatic.     Mouth/Throat:     Mouth: Mucous membranes are moist.     Pharynx: Oropharynx is clear.  Cardiovascular:     Rate and Rhythm: Normal rate and regular  rhythm.  Pulmonary:     Effort: Pulmonary effort is normal.     Breath sounds: No rhonchi or rales.  Abdominal:     General: Abdomen is flat. Bowel sounds are normal. There is no distension.     Palpations: Abdomen is soft.     Tenderness: There is no abdominal tenderness.  Musculoskeletal:        General: Normal range of motion.     Right lower leg: No edema.     Left lower leg: No edema.  Skin:    General: Skin is warm and dry.     Capillary Refill: Capillary refill takes less than 2 seconds.  Neurological:     General: No focal deficit present.     Mental Status: He is alert and oriented to person, place, and time.  Psychiatric:        Mood and Affect: Mood normal.        Behavior: Behavior normal.        03/11/2024    3:23 PM 02/06/2024   12:06 PM  Depression screen PHQ 2/9  Decreased Interest 0 0  Down, Depressed, Hopeless 0 0  PHQ - 2 Score 0 0  Altered sleeping  0  Tired, decreased energy  0  Change in appetite  0  Feeling bad or failure about yourself   0  Trouble concentrating  0  Moving slowly or fidgety/restless  0  Suicidal thoughts  0  PHQ-9 Score  0  Difficult doing work/chores  Not difficult at all       03/11/2024    3:23 PM 02/06/2024   12:06 PM  GAD 7 : Generalized Anxiety Score  Nervous, Anxious, on Edge 0 0  Control/stop worrying 0 0  Worry too much - different things  0  Trouble relaxing  0  Restless  0  Easily annoyed or irritable  0  Afraid - awful might happen  0  Total GAD 7 Score  0  Anxiety Difficulty  Not difficult at all    No results found for any visits on 03/11/24.    The 10-year ASCVD risk score (Arnett DK, et al., 2019) is: 9.9%    Assessment & Plan:  Type 2 diabetes mellitus with hyperglycemia, without long-term current use of insulin (HCC) Assessment & Plan: Tolerating metformin  without side effects. Reports home fasting glucoses around 110s and <120. Glucoses has risen up to 150 in evening after dinner. Has made  significant dietary changes and glucoses seem better controlled. Continue current medication. Seeing RD later this month. Given information on eye clinic in area for diabetic eye exam. Follow up in 2 months for repeat A1c.    Long term current use of oral hypoglycemic drug  Other orders -     metFORMIN  HCl ER; Take 1 tablet (500 mg total) by mouth 2 (two) times daily with a meal.  Dispense: 180 tablet; Refill: 1     Return in about 2 months (around 05/09/2024) for DM.    Harlene Saddler, MD "

## 2024-03-11 NOTE — Assessment & Plan Note (Addendum)
 Tolerating metformin  without side effects. Reports home fasting glucoses around 110s and <120. Glucoses has risen up to 150 in evening after dinner. Has made significant dietary changes and glucoses seem better controlled. Continue current medication. Seeing RD later this month. Given information on eye clinic in area for diabetic eye exam. Follow up in 2 months for repeat A1c.

## 2024-03-13 NOTE — Telephone Encounter (Signed)
 Duplicate request, refilled 03/11/24.  Requested Prescriptions  Pending Prescriptions Disp Refills   metFORMIN  (GLUCOPHAGE -XR) 500 MG 24 hr tablet [Pharmacy Med Name: METFORMIN  ER 500MG  24HR TABS] 180 tablet 1    Sig: TAKE 1 TABLET(500 MG) BY MOUTH TWICE DAILY WITH A MEAL     Endocrinology:  Diabetes - Biguanides Failed - 03/13/2024 10:12 AM      Failed - HBA1C is between 0 and 7.9 and within 180 days    Hgb A1c MFr Bld  Date Value Ref Range Status  01/29/2024 12.5 (H) 4.8 - 5.6 % Final    Comment:             Prediabetes: 5.7 - 6.4          Diabetes: >6.4          Glycemic control for adults with diabetes: <7.0          Failed - B12 Level in normal range and within 720 days    No results found for: VITAMINB12       Failed - CBC within normal limits and completed in the last 12 months    WBC  Date Value Ref Range Status  01/29/2024 7.0 3.4 - 10.8 x10E3/uL Final  08/18/2021 10.2 4.0 - 10.5 K/uL Final   RBC  Date Value Ref Range Status  01/29/2024 5.19 4.14 - 5.80 x10E6/uL Final  08/18/2021 5.11 4.22 - 5.81 MIL/uL Final   Hemoglobin  Date Value Ref Range Status  01/29/2024 15.2 13.0 - 17.7 g/dL Final   Hematocrit  Date Value Ref Range Status  01/29/2024 46.8 37.5 - 51.0 % Final   MCHC  Date Value Ref Range Status  01/29/2024 32.5 31.5 - 35.7 g/dL Final  93/85/7976 67.4 30.0 - 36.0 g/dL Final   [facility]  Date Value Ref Range Status  01/29/2024 29.3 26.6 - 33.0 pg Final  08/18/2021 29.4 26.0 - 34.0 pg Final   MCV  Date Value Ref Range Status  01/29/2024 90 79 - 97 fL Final   No results found for: PLTCOUNTKUC, LABPLAT, POCPLA RDW  Date Value Ref Range Status  01/29/2024 12.2 11.6 - 15.4 % Final         Passed - Cr in normal range and within 360 days    Creatinine, Ser  Date Value Ref Range Status  01/29/2024 1.04 0.76 - 1.27 mg/dL Final         Passed - eGFR in normal range and within 360 days    GFR calc Af Amer  Date Value Ref Range Status   07/01/2017 >60 >60 mL/min Final    Comment:    (NOTE) The eGFR has been calculated using the CKD EPI equation. This calculation has not been validated in all clinical situations. eGFR's persistently <60 mL/min signify possible Chronic Kidney Disease.    GFR, Estimated  Date Value Ref Range Status  08/18/2021 >60 >60 mL/min Final    Comment:    (NOTE) Calculated using the CKD-EPI Creatinine Equation (2021)    eGFR  Date Value Ref Range Status  01/29/2024 87 >59 mL/min/1.73 Final         Passed - Valid encounter within last 6 months    Recent Outpatient Visits           2 days ago Type 2 diabetes mellitus with hyperglycemia, without long-term current use of insulin [facility])   [facility], Har[place], MD   1 month ago Type 2  diabetes mellitus with hyperglycemia, without long-term current use of insulin [facility])   [facility], MD

## 2024-03-25 NOTE — Progress Notes (Unsigned)
" °  Start: *** end: ***    Portuguese Interpreter id:    Patient is here today ***. Patient would like to learn ***. Patient lives with ***.  *** shopping and cooking. Pt reports eating out *** times weekly.  Pt reports making the following changes including ***.  All Pt's questions were answered during this encounter.    History includes:  *** Medications include:  *** Labs noted:  ***   12.5%, met "

## 2024-04-01 ENCOUNTER — Encounter: Admitting: Dietician

## 2024-04-01 DIAGNOSIS — E1165 Type 2 diabetes mellitus with hyperglycemia: Secondary | ICD-10-CM

## 2024-05-03 ENCOUNTER — Ambulatory Visit: Admitting: Student

## 2024-05-23 ENCOUNTER — Ambulatory Visit: Admitting: Nurse Practitioner
